# Patient Record
Sex: Female | Born: 1970 | Race: White | Hispanic: No | Marital: Married | Smoking: Current every day smoker
Health system: Southern US, Community
[De-identification: ages and names within clinical notes are randomized; demographics above are authoritative.]

## PROBLEM LIST (undated history)

## (undated) DIAGNOSIS — E079 Disorder of thyroid, unspecified: Secondary | ICD-10-CM

## (undated) HISTORY — DX: Disorder of thyroid, unspecified: E07.9

## (undated) HISTORY — PX: APPENDECTOMY: SHX54

---

## 2012-08-18 ENCOUNTER — Other Ambulatory Visit (HOSPITAL_COMMUNITY): Payer: Self-pay | Admitting: Nurse Practitioner

## 2012-08-18 DIAGNOSIS — R221 Localized swelling, mass and lump, neck: Secondary | ICD-10-CM

## 2012-08-27 ENCOUNTER — Ambulatory Visit (HOSPITAL_COMMUNITY): Admission: RE | Admit: 2012-08-27 | Payer: Self-pay | Source: Ambulatory Visit

## 2013-06-22 ENCOUNTER — Encounter (INDEPENDENT_AMBULATORY_CARE_PROVIDER_SITE_OTHER): Payer: Self-pay

## 2013-06-22 ENCOUNTER — Encounter: Payer: Self-pay | Admitting: Family Medicine

## 2013-06-22 ENCOUNTER — Ambulatory Visit (INDEPENDENT_AMBULATORY_CARE_PROVIDER_SITE_OTHER): Payer: BC Managed Care – PPO | Admitting: Family Medicine

## 2013-06-22 VITALS — BP 104/61 | HR 90 | Temp 97.0°F | Ht 67.0 in | Wt 193.0 lb

## 2013-06-22 DIAGNOSIS — Z716 Tobacco abuse counseling: Secondary | ICD-10-CM

## 2013-06-22 DIAGNOSIS — L0291 Cutaneous abscess, unspecified: Secondary | ICD-10-CM

## 2013-06-22 DIAGNOSIS — Z7189 Other specified counseling: Secondary | ICD-10-CM

## 2013-06-22 DIAGNOSIS — F17200 Nicotine dependence, unspecified, uncomplicated: Secondary | ICD-10-CM

## 2013-06-22 MED ORDER — DOXYCYCLINE HYCLATE 100 MG PO CAPS: 100.0000 mg | ORAL_CAPSULE | Freq: Two times a day (BID) | ORAL | Status: DC

## 2013-06-22 MED ORDER — HYDROCODONE-ACETAMINOPHEN 5-325 MG PO TABS: 1.0000 | ORAL_TABLET | Freq: Four times a day (QID) | ORAL | Status: DC | PRN

## 2013-06-22 NOTE — Patient Instructions (Addendum)
Abscess An abscess is an infected area that contains a collection of pus and debris.It can occur in almost any part of the body. An abscess is also known as a furuncle or boil. CAUSES  An abscess occurs when tissue gets infected. This can occur from blockage of oil or sweat glands, infection of hair follicles, or a minor injury to the skin. As the body tries to fight the infection, pus collects in the area and creates pressure under the skin. This pressure causes pain. People with weakened immune systems have difficulty fighting infections and get certain abscesses more often.  SYMPTOMS Usually an abscess develops on the skin and becomes a painful mass that is red, warm, and tender. If the abscess forms under the skin, you may feel a moveable soft area under the skin. Some abscesses break open (rupture) on their own, but most will continue to get worse without care. The infection can spread deeper into the body and eventually into the bloodstream, causing you to feel ill.  DIAGNOSIS  Your caregiver will take your medical history and perform a physical exam. A sample of fluid may also be taken from the abscess to determine what is causing your infection. TREATMENT  Your caregiver may prescribe antibiotic medicines to fight the infection. However, taking antibiotics alone usually does not cure an abscess. Your caregiver may need to make a small cut (incision) in the abscess to drain the pus. In some cases, gauze is packed into the abscess to reduce pain and to continue draining the area. HOME CARE INSTRUCTIONS   Only take over-the-counter or prescription medicines for pain, discomfort, or fever as directed by your caregiver.  If you were prescribed antibiotics, take them as directed. Finish them even if you start to feel better.  If gauze is used, follow your caregiver's directions for changing the gauze.  To avoid spreading the infection:  Keep your draining abscess covered with a  bandage.  Wash your hands well.  Do not share personal care items, towels, or whirlpools with others.  Avoid skin contact with others.  Keep your skin and clothes clean around the abscess.  Keep all follow-up appointments as directed by your caregiver. SEEK MEDICAL CARE IF:   You have increased pain, swelling, redness, fluid drainage, or bleeding.  You have muscle aches, chills, or a general ill feeling.  You have a fever. MAKE SURE YOU:   Understand these instructions.  Will watch your condition.  Will get help right away if you are not doing well or get worse. Document Released: 05/29/2005 Document Revised: 02/18/2012 Document Reviewed: 11/01/2011 [employer] Patient Information 2014 [employer].  

## 2013-06-22 NOTE — Progress Notes (Signed)
New Patient History and Physical  Patient name: Haley Keith Medical record number: [id number] Date of birth: 01-Nov-1970 Age: 42 y.o. Gender: female  Primary Care Provider: Rudi Heap, MD  Chief Complaint: abscess  History of Present Illness: The patient presents today with chief complaint of left upper outer thigh abscess. Patient has a baseline history of hidradenitis in recurrent superficial abscess ease in the past. Has been seen by dermatology for this though not recently. No known diagnosis of MRSA in the past. Patient is a smoker. Nondiabetic. Patient states that she noticed a lesion about 4-5 days ago. Has had progressive redness and irritation. No fevers or chills. No nausea or vomiting.  Past Medical History: There are no active problems to display for this patient.  Past Medical History  Diagnosis Date  . Thyroid disease     hypothyroidism    Past Surgical History: Past Surgical History  Procedure Laterality Date  . Appendectomy    . Cesarean section      Social History: History   Social History  . Marital Status: Married    Spouse Name: N/A    Number of Children: N/A  . Years of Education: N/A   Social History Main Topics  . Smoking status: Current Every Day Smoker -- 1.00 packs/day for 10 years    Types: Cigarettes  . Smokeless tobacco: Never Used  . Alcohol Use: No  . Drug Use: No  . Sexual Activity: None   Other Topics Concern  . None   Social History Narrative  . None    Family History: Family History  Problem Relation Age of Onset  . Hypertension Mother   . Diabetes Mother   . COPD Mother   . COPD Father   . Diabetes Brother   . Thyroid disease Brother     hypothyroidism due to treatment for hyperthyroidism    Allergies: No Known Allergies  Current Outpatient Prescriptions  Medication Sig Dispense Refill  . levothyroxine (SYNTHROID, LEVOTHROID) 150 MCG tablet Take 150 mcg by mouth daily before breakfast.      . phentermine 37.5  MG capsule Take 37.5 mg by mouth every morning.      Marland Kitchen doxycycline (VIBRAMYCIN) 100 MG capsule Take 1 capsule (100 mg total) by mouth 2 (two) times daily.  20 capsule  0  . HYDROcodone-acetaminophen (NORCO/VICODIN) 5-325 MG per tablet Take 1 tablet by mouth every 6 (six) hours as needed for pain.  30 tablet  0   No current facility-administered medications for this visit.   Review Of Systems: 12 point ROS negative except as noted above in HPI.  Physical Exam: Filed Vitals:   06/22/13 1018  BP: 104/61  Pulse: 90  Temp: 97 F (36.1 C)    General: alert and cooperative HEENT: PERRLA and extra ocular movement intact Heart: S1, S2 normal, no murmur, rub or gallop, regular rate and rhythm Lungs: clear to auscultation Abdomen: abdomen is soft without significant tenderness, masses, organomegaly or guarding Extremities: L upper thigh/buttock abscess with marked induration and ttp  Skin:+ abscess in L upper thigh  Neurology: normal without focal findings  Labs and Imaging:  Assessment and Plan: Procedure:  Incision and drainage. Overall risk and benefits were discussed with patient prior to her seizure. Affected area was cleansed with Betadine. 2% Marcaine with epinephrine injected into affected area for anesthesia and clockwise fashion. Approximately 10-12 cc of lidocaine use. #11 blade use for incision of most indurated area with expression of copious amounts of purulent fluid. Anterior  abscess to right with sterile curved hemostats. Area packed with quarter-inch iodoform gauze. Topically packed. Minimal to mild amount of blood loss.  Cellulitis and abscess - Plan: doxycycline (VIBRAMYCIN) 100 MG capsule, HYDROcodone-acetaminophen (NORCO/VICODIN) 5-325 MG per tablet, Aerobic culture  Affected area incised and drained at bedside. We'll place on doxycycline for infectious coverage. Wound culture. Vicodin for pain. Plan for followup in the next 2 days for reevaluation. Will need surgery  followup given history of recurrent skin disease. Discussed with the patient. Also discussed infectious red flags. Follow up as needed.       Doree Albee MD

## 2013-06-23 ENCOUNTER — Encounter: Payer: Self-pay | Admitting: General Practice

## 2013-06-23 ENCOUNTER — Ambulatory Visit (INDEPENDENT_AMBULATORY_CARE_PROVIDER_SITE_OTHER): Payer: BC Managed Care – PPO | Admitting: General Practice

## 2013-06-23 VITALS — BP 110/84 | HR 84 | Temp 101.8°F | Wt 198.0 lb

## 2013-06-23 DIAGNOSIS — R52 Pain, unspecified: Secondary | ICD-10-CM

## 2013-06-23 DIAGNOSIS — R509 Fever, unspecified: Secondary | ICD-10-CM

## 2013-06-23 DIAGNOSIS — R51 Headache: Secondary | ICD-10-CM

## 2013-06-23 NOTE — Patient Instructions (Signed)
Headaches, Frequently Asked Questions MIGRAINE HEADACHES Q: What is migraine? What causes it? How can I treat it? A: Generally, migraine headaches begin as a dull ache. Then they develop into a constant, throbbing, and pulsating pain. You may experience pain at the temples. You may experience pain at the front or back of one or both sides of the head. The pain is usually accompanied by a combination of:  Nausea.  Vomiting.  Sensitivity to light and noise. Some people (about 15%) experience an aura (see below) before an attack. The cause of migraine is believed to be chemical reactions in the brain. Treatment for migraine may include over-the-counter or prescription medications. It may also include self-help techniques. These include relaxation training and biofeedback.  Q: What is an aura? A: About 15% of people with migraine get an "aura". This is a sign of neurological symptoms that occur before a migraine headache. You may see wavy or jagged lines, dots, or flashing lights. You might experience tunnel vision or blind spots in one or both eyes. The aura can include visual or auditory hallucinations (something imagined). It may include disruptions in smell (such as strange odors), taste or touch. Other symptoms include:  Numbness.  A "pins and needles" sensation.  Difficulty in recalling or speaking the correct word. These neurological events may last as long as 60 minutes. These symptoms will fade as the headache begins. Q: What is a trigger? A: Certain physical or environmental factors can lead to or "trigger" a migraine. These include:  Foods.  Hormonal changes.  Weather.  Stress. It is important to remember that triggers are different for everyone. To help prevent migraine attacks, you need to figure out which triggers affect you. Keep a headache diary. This is a good way to track triggers. The diary will help you talk to your healthcare professional about your condition. Q: Does  weather affect migraines? A: Bright sunshine, hot, humid conditions, and drastic changes in barometric pressure may lead to, or "trigger," a migraine attack in some people. But studies have shown that weather does not act as a trigger for everyone with migraines. Q: What is the link between migraine and hormones? A: Hormones start and regulate many of your body's functions. Hormones keep your body in balance within a constantly changing environment. The levels of hormones in your body are unbalanced at times. Examples are during menstruation, pregnancy, or menopause. That can lead to a migraine attack. In fact, about three quarters of all women with migraine report that their attacks are related to the menstrual cycle.  Q: Is there an increased risk of stroke for migraine sufferers? A: The likelihood of a migraine attack causing a stroke is very remote. That is not to say that migraine sufferers cannot have a stroke associated with their migraines. In persons under age 40, the most common associated factor for stroke is migraine headache. But over the course of a person's normal life span, the occurrence of migraine headache may actually be associated with a reduced risk of dying from cerebrovascular disease due to stroke.  Q: What are acute medications for migraine? A: Acute medications are used to treat the pain of the headache after it has started. Examples over-the-counter medications, NSAIDs, ergots, and triptans.  Q: What are the triptans? A: Triptans are the newest class of abortive medications. They are specifically targeted to treat migraine. Triptans are vasoconstrictors. They moderate some chemical reactions in the brain. The triptans work on receptors in your brain. Triptans help   to restore the balance of a neurotransmitter called serotonin. Fluctuations in levels of serotonin are thought to be a main cause of migraine.  Q: Are over-the-counter medications for migraine effective? A:  Over-the-counter, or "OTC," medications may be effective in relieving mild to moderate pain and associated symptoms of migraine. But you should see your caregiver before beginning any treatment regimen for migraine.  Q: What are preventive medications for migraine? A: Preventive medications for migraine are sometimes referred to as "prophylactic" treatments. They are used to reduce the frequency, severity, and length of migraine attacks. Examples of preventive medications include antiepileptic medications, antidepressants, beta-blockers, calcium channel blockers, and NSAIDs (nonsteroidal anti-inflammatory drugs). Q: Why are anticonvulsants used to treat migraine? A: During the past few years, there has been an increased interest in antiepileptic drugs for the prevention of migraine. They are sometimes referred to as "anticonvulsants". Both epilepsy and migraine may be caused by similar reactions in the brain.  Q: Why are antidepressants used to treat migraine? A: Antidepressants are typically used to treat people with depression. They may reduce migraine frequency by regulating chemical levels, such as serotonin, in the brain.  Q: What alternative therapies are used to treat migraine? A: The term "alternative therapies" is often used to describe treatments considered outside the scope of conventional Western medicine. Examples of alternative therapy include acupuncture, acupressure, and yoga. Another common alternative treatment is herbal therapy. Some herbs are believed to relieve headache pain. Always discuss alternative therapies with your caregiver before proceeding. Some herbal products contain arsenic and other toxins. TENSION HEADACHES Q: What is a tension-type headache? What causes it? How can I treat it? A: Tension-type headaches occur randomly. They are often the result of temporary stress, anxiety, fatigue, or anger. Symptoms include soreness in your temples, a tightening band-like sensation  around your head (a "vice-like" ache). Symptoms can also include a pulling feeling, pressure sensations, and contracting head and neck muscles. The headache begins in your forehead, temples, or the back of your head and neck. Treatment for tension-type headache may include over-the-counter or prescription medications. Treatment may also include self-help techniques such as relaxation training and biofeedback. CLUSTER HEADACHES Q: What is a cluster headache? What causes it? How can I treat it? A: Cluster headache gets its name because the attacks come in groups. The pain arrives with little, if any, warning. It is usually on one side of the head. A tearing or bloodshot eye and a runny nose on the same side of the headache may also accompany the pain. Cluster headaches are believed to be caused by chemical reactions in the brain. They have been described as the most severe and intense of any headache type. Treatment for cluster headache includes prescription medication and oxygen. SINUS HEADACHES Q: What is a sinus headache? What causes it? How can I treat it? A: When a cavity in the bones of the face and skull (a sinus) becomes inflamed, the inflammation will cause localized pain. This condition is usually the result of an allergic reaction, a tumor, or an infection. If your headache is caused by a sinus blockage, such as an infection, you will probably have a fever. An x-ray will confirm a sinus blockage. Your caregiver's treatment might include antibiotics for the infection, as well as antihistamines or decongestants.  REBOUND HEADACHES Q: What is a rebound headache? What causes it? How can I treat it? A: A pattern of taking acute headache medications too often can lead to a condition known as "rebound headache."   A pattern of taking too much headache medication includes taking it more than 2 days per week or in excessive amounts. That means more than the label or a caregiver advises. With rebound  headaches, your medications not only stop relieving pain, they actually begin to cause headaches. Doctors treat rebound headache by tapering the medication that is being overused. Sometimes your caregiver will gradually substitute a different type of treatment or medication. Stopping may be a challenge. Regularly overusing a medication increases the potential for serious side effects. Consult a caregiver if you regularly use headache medications more than 2 days per week or more than the label advises. ADDITIONAL QUESTIONS AND ANSWERS Q: What is biofeedback? A: Biofeedback is a self-help treatment. Biofeedback uses special equipment to monitor your body's involuntary physical responses. Biofeedback monitors:  Breathing.  Pulse.  Heart rate.  Temperature.  Muscle tension.  Brain activity. Biofeedback helps you refine and perfect your relaxation exercises. You learn to control the physical responses that are related to stress. Once the technique has been mastered, you do not need the equipment any more. Q: Are headaches hereditary? A: Four out of five (80%) of people that suffer report a family history of migraine. Scientists are not sure if this is genetic or a family predisposition. Despite the uncertainty, a child has a 50% chance of having migraine if one parent suffers. The child has a 75% chance if both parents suffer.  Q: Can children get headaches? A: By the time they reach high school, most young people have experienced some type of headache. Many safe and effective approaches or medications can prevent a headache from occurring or stop it after it has begun.  Q: What type of doctor should I see to diagnose and treat my headache? A: Start with your primary caregiver. Discuss his or her experience and approach to headaches. Discuss methods of classification, diagnosis, and treatment. Your caregiver may decide to recommend you to a headache specialist, depending upon your symptoms or other  physical conditions. Having diabetes, allergies, etc., may require a more comprehensive and inclusive approach to your headache. The National Head[employer] Foundation will provide, upon request, a list of [facility] physician members in your state. Document Released: 11/09/2003 Document Revised: 11/11/2011 Document Reviewed: 04/18/2008 [facility] Patient Information 2014 [place], [place].  Fever  Fever is a higher-than-normal body temperature. A normal temperature varies with:  Age.  How it is measured (mouth, underarm, rectal, or ear).  Time of day. In an adult, an oral temperature around 98.6 Fahrenheit (F) or 37 Celsius (C) is considered normal. A rise in temperature of about 1.8 F or 1 C is generally considered a fever (100.4 F or 38 C). In an infant age 33 days or less, a rectal temperature of 100.4 F (38 C) generally is regarded as fever. Fever is not a disease but can be a symptom of illness. CAUSES   Fever is most commonly caused by infection.  Some non-infectious problems can cause fever. For example:  Some arthritis problems.  Problems with the thyroid or adrenal glands.  Immune system problems.  Some kinds of cancer.  A reaction to certain medicines.  Occasionally, the source of a fever cannot be determined. This is sometimes called a "Fever of Unknown Origin" (FUO).  Some situations may lead to a temporary rise in body temperature that may go away on its own. Examples are:  Childbirth.  Surgery.  Some situations may cause a rise in body temperature but these are not considered "true  fever". Examples are:  Intense exercise.  Dehydration.  Exposure to high outside or room temperatures. SYMPTOMS   Feeling warm or hot.  Fatigue or feeling exhausted.  Aching all over.  Chills.  Shivering.  Sweats. DIAGNOSIS  A fever can be suspected by your caregiver feeling that your skin is unusually warm. The fever is confirmed by taking a temperature with a thermometer.  Temperatures can be taken different ways. Some methods are accurate and some are not: With adults, adolescents, and children:   An oral temperature is used most commonly.  An ear thermometer will only be accurate if it is positioned as recommended by the manufacturer.  Under the arm temperatures are not accurate and not recommended.  Most electronic thermometers are fast and accurate. Infants and Toddlers:  Rectal temperatures are recommended and most accurate.  Ear temperatures are not accurate in this age group and are not recommended.  Skin thermometers are not accurate. RISKS AND COMPLICATIONS   During a fever, the body uses more oxygen, so a person with a fever may develop rapid breathing or shortness of breath. This can be dangerous especially in people with heart or lung disease.  The sweats that occur following a fever can cause dehydration.  High fever can cause seizures in infants and children.  Older persons can develop confusion during a fever. TREATMENT   Medications may be used to control temperature.  Do not give aspirin to children with fevers. There is an association with Reye's syndrome. Reye's syndrome is a rare but potentially deadly disease.  If an infection is present and medications have been prescribed, take them as directed. Finish the full course of medications until they are gone.  Sponging or bathing with room-temperature water may help reduce body temperature. Do not use ice water or alcohol sponge baths.  Do not over-bundle children in blankets or heavy clothes.  Drinking adequate fluids during an illness with fever is important to prevent dehydration. HOME CARE INSTRUCTIONS   For adults, rest and adequate fluid intake are important. Dress according to how you feel, but do not over-bundle.  Drink enough water and/or fluids to keep your urine clear or pale yellow.  For infants over 3 months and children, giving medication as directed by your  caregiver to control fever can help with comfort. The amount to be given is based on the child's weight. Do NOT give more than is recommended. SEEK MEDICAL CARE IF:   You or your child are unable to keep fluids down.  Vomiting or diarrhea develops.  You develop a skin rash.  An oral temperature above 102 F (38.9 C) develops, or a fever which persists for over 3 days.  You develop excessive weakness, dizziness, fainting or extreme thirst.  Fevers keep coming back after 3 days. SEEK IMMEDIATE MEDICAL CARE IF:   Shortness of breath or trouble breathing develops  You pass out.  You feel you are making little or no urine.  New pain develops that was not there before (such as in the head, neck, chest, back, or abdomen).  You cannot hold down fluids.  Vomiting and diarrhea persist for more than a day or two.  You develop a stiff neck and/or your eyes become sensitive to light.  An unexplained temperature above 102 F (38.9 C) develops. Document Released: 08/19/2005 Document Revised: 11/11/2011 Document Reviewed: 08/04/2008 [facility] Patient Information 2014 [place], [place].

## 2013-06-23 NOTE — Progress Notes (Signed)
  Subjective:    Patient ID: Haley Keith, female    DOB: Sep 22, 1970, 42 y.o.   MRN: [id number]  Headache  This is a new problem. The current episode started yesterday. The problem occurs intermittently. The problem has been unchanged. The pain is located in the frontal region. The pain does not radiate. The pain quality is similar to prior headaches. The quality of the pain is described as aching. The pain is at a severity of 3/10. Associated symptoms include dizziness, a fever and muscle aches. Pertinent negatives include no abdominal pain, blurred vision, coughing, drainage, ear pain, nausea, neck pain, numbness, phonophobia, photophobia, sinus pressure, sore throat, tingling, tinnitus, visual change, vomiting or weakness. She has tried acetaminophen and NSAIDs for the symptoms. The treatment provided moderate relief. There is no history of cluster headaches, migraine headaches, recent head traumas or sinus disease.  Reports incision and drainage of left thigh abscess on yesterday and taking antibiotics as prescribed. Reports dressing dry and intact.      Review of Systems  Constitutional: Positive for fever and chills.       Temp unmeasured  HENT: Negative for congestion, ear pain, postnasal drip, sinus pressure, sore throat and tinnitus.   Eyes: Negative for blurred vision and photophobia.  Respiratory: Negative for cough, chest tightness and shortness of breath.   Cardiovascular: Negative for chest pain and palpitations.  Gastrointestinal: Negative for nausea, vomiting and abdominal pain.  Musculoskeletal: Negative for neck pain.  Neurological: Positive for dizziness and headaches. Negative for tingling, weakness and numbness.       Objective:   Physical Exam  Constitutional: She is oriented to person, place, and time. She appears well-developed and well-nourished.  HENT:  Head: Normocephalic and atraumatic.  Right Ear: External ear normal.  Left Ear: External ear normal.   Mouth/Throat: Oropharynx is clear and moist.  Eyes: Conjunctivae and EOM are normal. Pupils are equal, round, and reactive to light.  Neck: Normal range of motion. Neck supple. No thyromegaly present.  Cardiovascular: Normal rate, regular rhythm and normal heart sounds.   Pulmonary/Chest: Effort normal and breath sounds normal.  Lymphadenopathy:    She has no cervical adenopathy.  Neurological: She is alert and oriented to person, place, and time.  Skin: Skin is warm and dry.  Psychiatric: She has a normal mood and affect.    Results for orders placed in visit on 06/23/13  POCT INFLUENZA A/B      Result Value Range   Influenza A, POC Negative     Influenza B, POC Negative           Assessment & Plan:  1. Fever,  2. Body aches, and 3. Headache  - POCT Influenza A/B - POCT CBC - CBC With differential/Platelet (results incomplete, preliminary wbc 7.8) -Continue antibiotic and vicodin as prescribed -Continue monitoring temperature -may call provider on call with questions or concerns -seek emergency medical treatment if temperature greater than 102 -Maintain scheduled appointment  -Patient verbalized understanding -Coralie Keens, FNP-C

## 2013-06-24 ENCOUNTER — Encounter: Payer: Self-pay | Admitting: Family Medicine

## 2013-06-24 ENCOUNTER — Ambulatory Visit (INDEPENDENT_AMBULATORY_CARE_PROVIDER_SITE_OTHER): Payer: BC Managed Care – PPO | Admitting: Family Medicine

## 2013-06-24 VITALS — BP 93/59 | HR 85 | Temp 98.4°F | Ht 67.0 in | Wt 198.0 lb

## 2013-06-24 DIAGNOSIS — R509 Fever, unspecified: Secondary | ICD-10-CM

## 2013-06-24 DIAGNOSIS — L0231 Cutaneous abscess of buttock: Secondary | ICD-10-CM

## 2013-06-24 LAB — CBC WITH DIFFERENTIAL
Basos: 0 %
Eos: 1 %
HCT: 36.3 % (ref 34.0–46.6)
Hemoglobin: 12.5 g/dL (ref 11.1–15.9)
Immature Grans (Abs): 0 10*3/uL (ref 0.0–0.1)
Lymphocytes Absolute: 1 10*3/uL (ref 0.7–3.1)
MCH: 31.3 pg (ref 26.6–33.0)
MCV: 91 fL (ref 79–97)
Monocytes Absolute: 0.5 10*3/uL (ref 0.1–0.9)
Neutrophils Absolute: 5.5 10*3/uL (ref 1.4–7.0)
RBC: 4 x10E6/uL (ref 3.77–5.28)

## 2013-06-24 LAB — POCT CBC
Hemoglobin: 13 g/dL (ref 12.2–16.2)
Lymph, poc: 2.5 (ref 0.6–3.4)
MCH, POC: 30.2 pg (ref 27–31.2)
MCHC: 33.1 g/dL (ref 31.8–35.4)
MCV: 91.4 fL (ref 80–97)
MPV: 8.6 fL (ref 0–99.8)
POC LYMPH PERCENT: 34.5 %L (ref 10–50)
Platelet Count, POC: 202 10*3/uL (ref 142–424)
RBC: 4.3 M/uL (ref 4.04–5.48)
RDW, POC: 14.7 %
WBC: 7.3 10*3/uL (ref 4.6–10.2)

## 2013-06-24 NOTE — Progress Notes (Signed)
  Subjective:    Patient ID: Haley Keith, female    DOB: 10-10-1970, 42 y.o.   MRN: [id number]  HPI Patient presents today for wound recheck. Patient seen 2 days ago for a left buttock abscess. Area was incised and drained at bedside. Wound cultures obtained. Patient was placed on oral doxycycline for MRSA coverage. In discussing with patient, and she reports that she was seen yesterday for fever with a MAXIMUM TEMPERATURE of 101.5 as well as generalized malaise. Patient was seen here at [facility]. A CBC was drawn with patient having a white count around 7. Patient was formally diagnosed with a questionable viral infection. Patient states her last fever was yesterday evening with a temperature around 101. Patient has defervescence this point. Per patient, left buttock abscess has seemed to clinically improve mildly to moderately. Pain is significantly improved. Does still have some pain with sitting.   Review of Systems  All other systems reviewed and are negative.       Objective:   Physical Exam  Constitutional: She appears well-developed and well-nourished.  HENT:  Head: Normocephalic and atraumatic.  Eyes: Pupils are equal, round, and reactive to light.  Neck: Normal range of motion.  Cardiovascular: Normal rate and regular rhythm.   Pulmonary/Chest: Effort normal.  Abdominal: Soft.  Neurological: She is alert.  Skin: Rash noted.          Assessment & Plan:  The lesion is clinically improved with overall reassuring exam, however-given the patient has had a significant fever within the past 24 hours and there is some concern about outpatient treatment failure. Discussed with patient at length she would benefit from hospitalization to further evaluate her symptoms. Patient adamantly refuses this. Patient states she feels well and there is no need for this . CBC today is reassuring with a white blood cell count of 7.3 (unchanged from yesterday). Wound  culture still pending.  Hemodynamically stable. However we'll also obtain blood cultures to rule out bacteremia. Patient is agreeable that if she develops any recurrence of systemic symptoms including generalized malaise or fever she will go directly to the hospital. Discussed with patient at length the importance of this as she is at high risk of systemic illness given recent symptoms. The patient expressed understanding of this. Area repacked and dressing changed.

## 2013-06-26 ENCOUNTER — Ambulatory Visit (INDEPENDENT_AMBULATORY_CARE_PROVIDER_SITE_OTHER): Payer: BC Managed Care – PPO | Admitting: Family Medicine

## 2013-06-26 VITALS — BP 100/64 | HR 76 | Temp 98.3°F | Ht 67.0 in | Wt 198.0 lb

## 2013-06-26 DIAGNOSIS — Z4800 Encounter for change or removal of nonsurgical wound dressing: Secondary | ICD-10-CM

## 2013-06-26 NOTE — Patient Instructions (Signed)
Keep area dry. Go to ED if fever develops.

## 2013-06-26 NOTE — Progress Notes (Signed)
Patient returns today for dressing change and wound check. Reports no pain at this time. She has not taken pain medication in 2 days. Dressing became wet when she showered yesterday even though she attempted to cover it with plastic. She replaced the dressing with a dry one.  Redness and induration size has decreased.  No drainage expressed from wound. Small amount of purulent drainage present on dressing. Satellite area has decreased in size, pain and redness as well.    Area cleaned with NS. Iodoform packing replaced. Wound covered with 2x2 and secured with hypafix. Several large pieces of tegaderm given to patient to cover area before showering. She will change entire dressing if it becomes wet. Appt scheduled for dressing change in 2 days. Go to ED if fever develops. Pt states understanding and agreement to plan.

## 2013-06-27 ENCOUNTER — Encounter: Payer: Self-pay | Admitting: Family Medicine

## 2013-06-28 ENCOUNTER — Ambulatory Visit (INDEPENDENT_AMBULATORY_CARE_PROVIDER_SITE_OTHER): Payer: BC Managed Care – PPO | Admitting: *Deleted

## 2013-06-28 DIAGNOSIS — Z4800 Encounter for change or removal of nonsurgical wound dressing: Secondary | ICD-10-CM

## 2013-06-28 NOTE — Progress Notes (Signed)
Patient comes in today for dressing change. She had to change the outer dressing yesterday because it got wet in the shower.  Denies any pain. Has not taken any pain medication since last week.  She noted a small amount of drainage on dressing yesterday.  Denies fever or chills.  Dressing removed. Small amount of serosanguinous drainage on dressing.  Erythema greatly improved. Induration has decreased to 2.5 x 2.5cm.  Area cleaned with NS. Repacked with iodoform gauze and covered with sterile 2x2 and secured with Hypafix. Patient to return to clinic in 3 days for rck with Dr. Alvester Morin and PRN.

## 2013-07-01 ENCOUNTER — Ambulatory Visit (INDEPENDENT_AMBULATORY_CARE_PROVIDER_SITE_OTHER): Payer: BC Managed Care – PPO | Admitting: Family Medicine

## 2013-07-01 ENCOUNTER — Encounter: Payer: Self-pay | Admitting: Family Medicine

## 2013-07-01 VITALS — BP 99/64 | HR 78 | Temp 97.8°F | Ht 67.0 in | Wt 194.0 lb

## 2013-07-01 DIAGNOSIS — Z716 Tobacco abuse counseling: Secondary | ICD-10-CM

## 2013-07-01 DIAGNOSIS — L0291 Cutaneous abscess, unspecified: Secondary | ICD-10-CM

## 2013-07-01 DIAGNOSIS — Z7189 Other specified counseling: Secondary | ICD-10-CM

## 2013-07-01 DIAGNOSIS — F17200 Nicotine dependence, unspecified, uncomplicated: Secondary | ICD-10-CM

## 2013-07-01 MED ORDER — DOXYCYCLINE HYCLATE 100 MG PO CAPS: 100.0000 mg | ORAL_CAPSULE | Freq: Two times a day (BID) | ORAL | Status: DC

## 2013-07-01 NOTE — Progress Notes (Signed)
  Subjective:    Patient ID: Haley Keith, female    DOB: [birth year], 42 y.o.   MRN: [id number]  HPI Pt presents today for wound follow up  Was originally seen on 10/21 for L buttock abscess.  Wound cx grew out MSSA Has bene on doxy since 10/21. Compliant with this.  Had one episode of fever 10/21.  Was seen following day and dxd with fever.  Pt has not had any fevers since this point.      Review of Systems  All other systems reviewed and are negative.       Objective:   Physical Exam  Constitutional: She appears well-developed and well-nourished.  HENT:  Head: Normocephalic and atraumatic.  Eyes: Conjunctivae are normal. Pupils are equal, round, and reactive to light.  Neck: Normal range of motion.  Cardiovascular: Normal rate and regular rhythm.   Pulmonary/Chest: Effort normal.  Abdominal: Soft.  Musculoskeletal: Normal range of motion.  Neurological: She is alert.  Skin: Skin is warm.     R buttock abscess well healed.  Minimal tenderness and peripheral erythema.  2-3 cm iodoform packing present.            Assessment & Plan:  Cellulitis and abscess  Overall healing well.  Will extend abx for additional 4 days for total of 14 days of treatment  Discussed smoking cessation and shaving avoidance.  Followup in 3-4 days for general recheck and packing removal. Consider surgery referral if this recurs given extend of prior disease.  Discussed infectious and systemic red flags.  Go to ER if fever spikes.

## 2013-07-01 NOTE — Patient Instructions (Signed)
Wound Care Wound care helps prevent pain and infection.  You may need a tetanus shot if:  You cannot remember when you had your last tetanus shot.  You have never had a tetanus shot.  The injury broke your skin. If you need a tetanus shot and you choose not to have one, you may get tetanus. Sickness from tetanus can be serious. HOME CARE   Only take medicine as told by your doctor.  Clean the wound daily with mild soap and water.  Change any bandages (dressings) as told by your doctor.  Put medicated cream and a bandage on the wound as told by your doctor.  Change the bandage if it gets wet, dirty, or starts to smell.  Take showers. Do not take baths, swim, or do anything that puts your wound under water.  Rest and raise (elevate) the wound until the pain and puffiness (swelling) are better.  Keep all doctor visits as told. GET HELP RIGHT AWAY IF:   Yellowish-white fluid (pus) comes from the wound.  Medicine does not lessen your pain.  There is a red streak going away from the wound.  You have a fever. MAKE SURE YOU:   Understand these instructions.  Will watch your condition.  Will get help right away if you are not doing well or get worse. Document Released: 05/28/2008 Document Revised: 11/11/2011 Document Reviewed: 12/23/2010 [employer] Patient Information 2014 [employer].  

## 2013-07-02 ENCOUNTER — Telehealth: Payer: Self-pay | Admitting: Family Medicine

## 2013-07-05 NOTE — Telephone Encounter (Signed)
DONE

## 2013-07-06 ENCOUNTER — Ambulatory Visit: Payer: BC Managed Care – PPO | Admitting: Family Medicine

## 2013-07-07 ENCOUNTER — Ambulatory Visit (INDEPENDENT_AMBULATORY_CARE_PROVIDER_SITE_OTHER): Payer: BC Managed Care – PPO | Admitting: Family Medicine

## 2013-07-07 ENCOUNTER — Encounter: Payer: Self-pay | Admitting: Family Medicine

## 2013-07-07 VITALS — BP 103/60 | HR 78 | Temp 98.3°F | Ht 67.0 in | Wt 197.0 lb

## 2013-07-07 DIAGNOSIS — Z5189 Encounter for other specified aftercare: Secondary | ICD-10-CM

## 2013-07-07 DIAGNOSIS — Z716 Tobacco abuse counseling: Secondary | ICD-10-CM

## 2013-07-07 DIAGNOSIS — Z7189 Other specified counseling: Secondary | ICD-10-CM

## 2013-07-07 DIAGNOSIS — F17200 Nicotine dependence, unspecified, uncomplicated: Secondary | ICD-10-CM

## 2013-07-07 NOTE — Progress Notes (Signed)
Labs negative Please inform pt if not already done Thank you

## 2013-07-07 NOTE — Patient Instructions (Signed)
Monistat 3 day Ovule. Insert at night.

## 2013-07-07 NOTE — Progress Notes (Signed)
  Subjective:    Patient ID: Haley Keith, female    DOB: [birth year], 42 y.o.   MRN: [id number]  HPI Pt presents today for wound follow up  Was originally seen on 10/21 for L buttock abscess.  Wound cx grew out MSSA  Has bene on doxy since 10/21. Compliant with this.  Had one episode of fever 10/21.  Was seen following day and dxd with fever.  Pt has not had any fevers since this point.  Was placed on extended course of doxy for soft tissue coverage. Completed 14 day course.  Still afebrile.  Wound healing well per pt.      Review of Systems  All other systems reviewed and are negative.       Objective:   Physical Exam  Constitutional: She appears well-developed and well-nourished.  HENT:  Head: Normocephalic and atraumatic.  Eyes: Pupils are equal, round, and reactive to light.  Neck: Normal range of motion.  Cardiovascular: Normal rate and regular rhythm.   Pulmonary/Chest: Effort normal and breath sounds normal.  Abdominal: Soft.  Skin: Skin is warm.     L buttock wound  Well healed.  1-2 cm iodoform gauze packing present.            Assessment & Plan:  Wound check, abscess  Overall healing well  Near complete resolution.  Still with small pocket of space present that me loculate One more packing with aroun 1 cm iodoform gauze.  Follow up in 2-3 days for packing removal  Discussed infectious red flags at length as well as smoking cessation.

## 2014-02-11 ENCOUNTER — Telehealth: Payer: Self-pay | Admitting: Family Medicine

## 2014-02-11 NOTE — Telephone Encounter (Signed)
Patient aware that she would have to be seen to get labs and in order for [place] to call in her thyroid medicine

## 2014-04-01 ENCOUNTER — Telehealth: Payer: Self-pay | Admitting: Family Medicine

## 2014-04-04 ENCOUNTER — Telehealth: Payer: Self-pay | Admitting: Family Medicine

## 2014-04-04 NOTE — Telephone Encounter (Signed)
appt scheduled for tomorrow with bill

## 2014-04-04 NOTE — Telephone Encounter (Signed)
Denied, needs Thyroid labs ( last done in 2013), needs appt. Patient aware

## 2014-04-05 ENCOUNTER — Ambulatory Visit (INDEPENDENT_AMBULATORY_CARE_PROVIDER_SITE_OTHER): Payer: 59 | Admitting: Family Medicine

## 2014-04-05 ENCOUNTER — Encounter: Payer: Self-pay | Admitting: Family Medicine

## 2014-04-05 VITALS — BP 103/62 | HR 90 | Temp 98.1°F | Ht 67.0 in | Wt 202.6 lb

## 2014-04-05 DIAGNOSIS — L680 Hirsutism: Secondary | ICD-10-CM

## 2014-04-05 DIAGNOSIS — Z0000 Encounter for general adult medical examination without abnormal findings: Secondary | ICD-10-CM

## 2014-04-05 DIAGNOSIS — E039 Hypothyroidism, unspecified: Secondary | ICD-10-CM

## 2014-04-05 DIAGNOSIS — E038 Other specified hypothyroidism: Secondary | ICD-10-CM

## 2014-04-05 LAB — POCT CBC
Granulocyte percent: 61 %G (ref 37–80)
HCT, POC: 38.9 % (ref 37.7–47.9)
Hemoglobin: 13.1 g/dL (ref 12.2–16.2)
Lymph, poc: 3.3 (ref 0.6–3.4)
MCH, POC: 31.6 pg — AB (ref 27–31.2)
MCHC: 8.6 g/dL — AB (ref 31.8–35.4)
MCV: 93.7 fL (ref 80–97)
MPV: 8.6 fL (ref 0–99.8)
POC Granulocyte: 6 (ref 2–6.9)
POC LYMPH PERCENT: 33.7 %L (ref 10–50)
Platelet Count, POC: 215 10*3/uL (ref 142–424)
RBC: 4.2 M/uL (ref 4.04–5.48)
RDW, POC: 33.7 %
WBC: 9.8 10*3/uL (ref 4.6–10.2)

## 2014-04-05 NOTE — Progress Notes (Signed)
   Subjective:    Patient ID: Haley Keith, female    DOB: Aug 13, 1971, 43 y.o.   MRN: [id number]  HPI This 43 y.o. female presents for evaluation of routine follow up.  She has hx of hypothyroidism.   Review of Systems No chest pain, SOB, HA, dizziness, vision change, N/V, diarrhea, constipation, dysuria, urinary urgency or frequency, myalgias, arthralgias or rash.     Objective:   Physical Exam  Vital signs noted  Well developed well nourished female.  HEENT - Head atraumatic Normocephalic                Eyes - PERRLA, Conjuctiva - clear Sclera- Clear EOMI                Ears - EAC's Wnl TM's Wnl Gross Hearing WNL                Nose - Nares patent                 Throat - oropharanx wnl Respiratory - Lungs CTA bilateral Cardiac - RRR S1 and S2 without murmur GI - Abdomen soft Nontender and bowel sounds active x 4 Extremities - No edema. Neuro - Grossly intact. Skin - facial hair on chin      Assessment & Plan:  Other specified hypothyroidism - Plan: CANCELED: Thyroid Panel With TSH  Routine general medical examination at a health care facility - Plan: POCT CBC, Lipid panel, Vit D  25 hydroxy (rtn osteoporosis monitoring), CMP14+EGFR  Unspecified hypothyroidism - Plan: Thyroid Panel With TSH  Hirsutism - Plan: Ambulatory referral to Dermatology  Lysbeth Penner FNP

## 2014-04-06 LAB — LIPID PANEL
Chol/HDL Ratio: 3.8 ratio units (ref 0.0–4.4)
Cholesterol, Total: 136 mg/dL (ref 100–199)
HDL: 36 mg/dL — ABNORMAL LOW (ref >39)
LDL Calculated: 72 mg/dL (ref 0–99)
Triglycerides: 140 mg/dL (ref 0–149)
VLDL Cholesterol Cal: 28 mg/dL (ref 5–40)

## 2014-04-06 LAB — CMP14+EGFR
ALT: 14 IU/L (ref 0–32)
AST: 20 IU/L (ref 0–40)
Albumin/Globulin Ratio: 1.3 (ref 1.1–2.5)
Albumin: 4 g/dL (ref 3.5–5.5)
Alkaline Phosphatase: 60 IU/L (ref 39–117)
BUN/Creatinine Ratio: 21 (ref 9–23)
BUN: 12 mg/dL (ref 6–24)
CO2: 22 mmol/L (ref 18–29)
Calcium: 9.1 mg/dL (ref 8.7–10.2)
Chloride: 104 mmol/L (ref 97–108)
Creatinine, Ser: 0.56 mg/dL — ABNORMAL LOW (ref 0.57–1.00)
GFR calc Af Amer: 131 mL/min/{1.73_m2} (ref >59)
GFR calc non Af Amer: 114 mL/min/{1.73_m2} (ref >59)
Globulin, Total: 3.2 g/dL (ref 1.5–4.5)
Glucose: 85 mg/dL (ref 65–99)
Potassium: 4.1 mmol/L (ref 3.5–5.2)
Sodium: 141 mmol/L (ref 134–144)
Total Bilirubin: <0.2 mg/dL (ref 0.0–1.2)
Total Protein: 7.2 g/dL (ref 6.0–8.5)

## 2014-04-06 LAB — VITAMIN D 25 HYDROXY (VIT D DEFICIENCY, FRACTURES): Vit D, 25-Hydroxy: 35.9 ng/mL (ref 30.0–100.0)

## 2014-04-06 LAB — THYROID PANEL WITH TSH
Free Thyroxine Index: 2.6 (ref 1.2–4.9)
T3 Uptake Ratio: 30 % (ref 24–39)
T4, Total: 8.5 ug/dL (ref 4.5–12.0)
TSH: 4.86 u[IU]/mL — ABNORMAL HIGH (ref 0.450–4.500)

## 2014-04-09 ENCOUNTER — Other Ambulatory Visit: Payer: Self-pay | Admitting: Family Medicine

## 2014-04-09 MED ORDER — LEVOTHYROXINE SODIUM 175 MCG PO TABS: 175.0000 ug | ORAL_TABLET | Freq: Every day | ORAL | Status: DC

## 2014-04-11 ENCOUNTER — Telehealth: Payer: Self-pay | Admitting: Family Medicine

## 2014-04-11 NOTE — Telephone Encounter (Signed)
done

## 2014-08-22 ENCOUNTER — Encounter: Payer: Self-pay | Admitting: Family Medicine

## 2014-08-22 ENCOUNTER — Ambulatory Visit (INDEPENDENT_AMBULATORY_CARE_PROVIDER_SITE_OTHER): Payer: 59 | Admitting: Family Medicine

## 2014-08-22 VITALS — BP 98/64 | HR 98 | Temp 98.5°F | Ht 67.0 in | Wt 229.0 lb

## 2014-08-22 DIAGNOSIS — J209 Acute bronchitis, unspecified: Secondary | ICD-10-CM

## 2014-08-22 MED ORDER — HYDROCODONE-HOMATROPINE 5-1.5 MG/5ML PO SYRP: ORAL_SOLUTION | ORAL | Status: DC

## 2014-08-22 MED ORDER — AZITHROMYCIN 250 MG PO TABS: ORAL_TABLET | ORAL | Status: DC

## 2014-08-22 NOTE — Progress Notes (Signed)
   Subjective:    Patient ID: Haley Keith, female    DOB: 1971/08/30, 43 y.o.   MRN: [id number]  HPI  for 5 day history of cough congestion and fever. Cough is productive of yellow sputum. She denies sinus headache or congestion but she does complain of wheezes in her chest. She is a nonsmoker. She denies sore throat. Temp has been as high as 100.8.    Review of Systems  Constitutional: Positive for fever.  HENT: Negative.   Eyes: Negative.   Respiratory: Positive for cough.   Cardiovascular: Negative.   Gastrointestinal: Negative.   Endocrine: Negative.   Genitourinary: Negative.   Hematological: Negative.   Psychiatric/Behavioral: Negative.        Objective:   Physical Exam  Constitutional: She appears well-developed and well-nourished.  HENT:  Head: Normocephalic.  Right Ear: External ear normal.  Left Ear: External ear normal.  Mouth/Throat: Oropharynx is clear and moist.  Cardiovascular: Normal rate and regular rhythm.   Pulmonary/Chest: Effort normal.  There are bilateral wheezes in her lungs. No rales or rhonchi.    BP 98/64 mmHg  Pulse 98  Temp(Src) 98.5 F (36.9 C) (Oral)  Ht 5\' 7"  (1.702 m)  Wt 229 lb (103.874 kg)  BMI 35.86 kg/m2  LMP 08/01/2014      Assessment & Plan:  1. Acute bronchitis, unspecified organism Rx: Zithromax, increase fluids and Mucinex and daytime, and Hycodan one or 2 teaspoons at bedtime to limit cough at night  Wardell Honour MD

## 2014-09-10 ENCOUNTER — Ambulatory Visit (INDEPENDENT_AMBULATORY_CARE_PROVIDER_SITE_OTHER): Payer: 59 | Admitting: General Practice

## 2014-09-10 VITALS — BP 98/64 | HR 85 | Temp 96.9°F | Ht 67.0 in | Wt 232.0 lb

## 2014-09-10 DIAGNOSIS — J069 Acute upper respiratory infection, unspecified: Secondary | ICD-10-CM

## 2014-09-10 DIAGNOSIS — H6123 Impacted cerumen, bilateral: Secondary | ICD-10-CM

## 2014-09-10 DIAGNOSIS — J0100 Acute maxillary sinusitis, unspecified: Secondary | ICD-10-CM

## 2014-09-10 DIAGNOSIS — J209 Acute bronchitis, unspecified: Secondary | ICD-10-CM

## 2014-09-10 MED ORDER — DOXYCYCLINE HYCLATE 100 MG PO TABS: 100.0000 mg | ORAL_TABLET | Freq: Two times a day (BID) | ORAL | Status: DC

## 2014-09-10 MED ORDER — PREDNISONE (PAK) 10 MG PO TABS: ORAL_TABLET | ORAL | Status: DC

## 2014-09-10 NOTE — Progress Notes (Signed)
   Subjective:    Patient ID: Haley Keith, female    DOB: 15-Nov-1970, 44 y.o.   MRN: [id number]  HPI Patient presents today with complaints of cough. She was seen and treated on 08/22/2014 for similar symptoms. Reports a mild improvement in symptoms (fever resolved), but cough still present. Kehinde reports completing zpac and taking minimal amount of hycodan. Also OTC Mucinex, without relief. Reports also having facial pressure at cheek areas and clogged ear sensation.  Currently on menses.   Review of Systems  Constitutional: Negative for fever and chills.  HENT: Positive for sinus pressure.   Respiratory: Positive for cough. Negative for chest tightness and shortness of breath.   Cardiovascular: Negative for chest pain and palpitations.  Neurological: Negative for dizziness, weakness and light-headedness.       Objective:   Physical Exam  Constitutional: She is oriented to person, place, and time. She appears well-developed and well-nourished.  HENT:  Head: Normocephalic and atraumatic.  Nose: Right sinus exhibits maxillary sinus tenderness. Left sinus exhibits maxillary sinus tenderness.  Bilateral cerumen impaction  Cardiovascular: Normal rate, regular rhythm and normal heart sounds.   Pulmonary/Chest: Effort normal. No respiratory distress. She has wheezes in the right lower field and the left lower field. She exhibits no tenderness.  Mild wheezing upon exhalation.   Neurological: She is alert and oriented to person, place, and time.  Skin: Skin is warm and dry.  Psychiatric: She has a normal mood and affect.          Assessment & Plan:  1. Acute bronchitis, unspecified organism  - predniSONE (STERAPRED UNI-PAK) 10 MG tablet; Take as directed  Dispense: 21 tablet; Refill: 0  2. Acute upper respiratory infection   3. Acute maxillary sinusitis, recurrence not specified  - doxycycline (VIBRA-TABS) 100 MG tablet; Take 1 tablet (100 mg total) by mouth 2 (two) times daily.   Dispense: 14 tablet; Refill: 0 -push po fluids -Continue antibiotics even if feeling better -Proper handwashing -RTO if symptoms worsen or unresolved or seek emergency medical treatment -Follow up for chest xray if cough persists greater than 5 more days -Patient verbalized understanding Erby Pian, FNP-C  4. Cerumen impaction, bilateral -Bilateral ear irrigation (pearly gray TM afterwards) -

## 2014-09-10 NOTE — Patient Instructions (Signed)
Acute Bronchitis Bronchitis is inflammation of the airways that extend from the windpipe into the lungs (bronchi). The inflammation often causes mucus to develop. This leads to a cough, which is the most common symptom of bronchitis.  In acute bronchitis, the condition usually develops suddenly and goes away over time, usually in a couple weeks. Smoking, allergies, and asthma can make bronchitis worse. Repeated episodes of bronchitis may cause further lung problems.  CAUSES Acute bronchitis is most often caused by the same virus that causes a cold. The virus can spread from person to person (contagious) through coughing, sneezing, and touching contaminated objects. SIGNS AND SYMPTOMS   Cough.   Fever.   Coughing up mucus.   Body aches.   Chest congestion.   Chills.   Shortness of breath.   Sore throat.  DIAGNOSIS  Acute bronchitis is usually diagnosed through a physical exam. Your health care provider will also ask you questions about your medical history. Tests, such as chest X-rays, are sometimes done to rule out other conditions.  TREATMENT  Acute bronchitis usually goes away in a couple weeks. Oftentimes, no medical treatment is necessary. Medicines are sometimes given for relief of fever or cough. Antibiotic medicines are usually not needed but may be prescribed in certain situations. In some cases, an inhaler may be recommended to help reduce shortness of breath and control the cough. A cool mist vaporizer may also be used to help thin bronchial secretions and make it easier to clear the chest.  HOME CARE INSTRUCTIONS  Get plenty of rest.   Drink enough fluids to keep your urine clear or pale yellow (unless you have a medical condition that requires fluid restriction). Increasing fluids may help thin your respiratory secretions (sputum) and reduce chest congestion, and it will prevent dehydration.   Take medicines only as directed by your health care provider.  If  you were prescribed an antibiotic medicine, finish it all even if you start to feel better.  Avoid smoking and secondhand smoke. Exposure to cigarette smoke or irritating chemicals will make bronchitis worse. If you are a smoker, consider using nicotine gum or skin patches to help control withdrawal symptoms. Quitting smoking will help your lungs heal faster.   Reduce the chances of another bout of acute bronchitis by washing your hands frequently, avoiding people with cold symptoms, and trying not to touch your hands to your mouth, nose, or eyes.   Keep all follow-up visits as directed by your health care provider.  SEEK MEDICAL CARE IF: Your symptoms do not improve after 1 week of treatment.  SEEK IMMEDIATE MEDICAL CARE IF:  You develop an increased fever or chills.   You have chest pain.   You have severe shortness of breath.  You have bloody sputum.   You develop dehydration.  You faint or repeatedly feel like you are going to pass out.  You develop repeated vomiting.  You develop a severe headache. MAKE SURE YOU:   Understand these instructions.  Will watch your condition.  Will get help right away if you are not doing well or get worse. Document Released: 09/26/2004 Document Revised: 01/03/2014 Document Reviewed: 02/09/2013 ExitCare Patient Information 2015 [employer]. This information is not intended to replace advice given to you by your health care provider. Make sure you discuss any questions you have with your health care provider. Sinusitis Sinusitis is redness, soreness, and inflammation of the paranasal sinuses. Paranasal sinuses are air pockets within the bones of your face (beneath the   eyes, the middle of the forehead, or above the eyes). In healthy paranasal sinuses, mucus is able to drain out, and air is able to circulate through them by way of your nose. However, when your paranasal sinuses are inflamed, mucus and air can become trapped. This can  allow bacteria and other germs to grow and cause infection. Sinusitis can develop quickly and last only a short time (acute) or continue over a long period (chronic). Sinusitis that lasts for more than 12 weeks is considered chronic.  CAUSES  Causes of sinusitis include:  Allergies.  Structural abnormalities, such as displacement of the cartilage that separates your nostrils (deviated septum), which can decrease the air flow through your nose and sinuses and affect sinus drainage.  Functional abnormalities, such as when the small hairs (cilia) that line your sinuses and help remove mucus do not work properly or are not present. SIGNS AND SYMPTOMS  Symptoms of acute and chronic sinusitis are the same. The primary symptoms are pain and pressure around the affected sinuses. Other symptoms include:  Upper toothache.  Earache.  Headache.  Bad breath.  Decreased sense of smell and taste.  A cough, which worsens when you are lying flat.  Fatigue.  Fever.  Thick drainage from your nose, which often is green and may contain pus (purulent).  Swelling and warmth over the affected sinuses. DIAGNOSIS  Your health care provider will perform a physical exam. During the exam, your health care provider may:  Look in your nose for signs of abnormal growths in your nostrils (nasal polyps).  Tap over the affected sinus to check for signs of infection.  View the inside of your sinuses (endoscopy) using an imaging device that has a light attached (endoscope). If your health care provider suspects that you have chronic sinusitis, one or more of the following tests may be recommended:  Allergy tests.  Nasal culture. A sample of mucus is taken from your nose, sent to a lab, and screened for bacteria.  Nasal cytology. A sample of mucus is taken from your nose and examined by your health care provider to determine if your sinusitis is related to an allergy. TREATMENT  Most cases of acute  sinusitis are related to a viral infection and will resolve on their own within 10 days. Sometimes medicines are prescribed to help relieve symptoms (pain medicine, decongestants, nasal steroid sprays, or saline sprays).  However, for sinusitis related to a bacterial infection, your health care provider will prescribe antibiotic medicines. These are medicines that will help kill the bacteria causing the infection.  Rarely, sinusitis is caused by a fungal infection. In theses cases, your health care provider will prescribe antifungal medicine. For some cases of chronic sinusitis, surgery is needed. Generally, these are cases in which sinusitis recurs more than 3 times per year, despite other treatments. HOME CARE INSTRUCTIONS   Drink plenty of water. Water helps thin the mucus so your sinuses can drain more easily.  Use a humidifier.  Inhale steam 3 to 4 times a day (for example, sit in the bathroom with the shower running).  Apply a warm, moist washcloth to your face 3 to 4 times a day, or as directed by your health care provider.  Use saline nasal sprays to help moisten and clean your sinuses.  Take medicines only as directed by your health care provider.  If you were prescribed either an antibiotic or antifungal medicine, finish it all even if you start to feel better. SEEK IMMEDIATE MEDICAL   CARE IF:  You have increasing pain or severe headaches.  You have nausea, vomiting, or drowsiness.  You have swelling around your face.  You have vision problems.  You have a stiff neck.  You have difficulty breathing. MAKE SURE YOU:   Understand these instructions.  Will watch your condition.  Will get help right away if you are not doing well or get worse. Document Released: 08/19/2005 Document Revised: 01/03/2014 Document Reviewed: 09/03/2011 ExitCare Patient Information 2015 [employer]. This information is not intended to replace advice given to you by your health care provider.  Make sure you discuss any questions you have with your health care provider.  

## 2015-03-28 ENCOUNTER — Ambulatory Visit (INDEPENDENT_AMBULATORY_CARE_PROVIDER_SITE_OTHER): Payer: 59 | Admitting: Physician Assistant

## 2015-03-28 ENCOUNTER — Encounter (INDEPENDENT_AMBULATORY_CARE_PROVIDER_SITE_OTHER): Payer: Self-pay

## 2015-03-28 ENCOUNTER — Encounter: Payer: Self-pay | Admitting: Physician Assistant

## 2015-03-28 VITALS — BP 98/63 | HR 91 | Temp 97.7°F | Ht 67.0 in | Wt 250.8 lb

## 2015-03-28 DIAGNOSIS — E039 Hypothyroidism, unspecified: Secondary | ICD-10-CM | POA: Diagnosis not present

## 2015-03-28 NOTE — Progress Notes (Signed)
   Subjective:    Patient ID: [name], female    DOB: [birth year], 44 y.o.   MRN: [id number]  HPI 44 y/o female with hypothyroidism presents for recheck on thyroid levels.    Review of Systems  Constitutional: Positive for diaphoresis and fatigue.  HENT: Negative.   Eyes: Negative.   Respiratory: Negative.   Gastrointestinal: Positive for constipation.  Endocrine: Negative.   Genitourinary: Negative.   Musculoskeletal: Negative.   Neurological: Negative.   Psychiatric/Behavioral: Negative.        Objective:   Physical Exam  Constitutional: She is oriented to person, place, and time. She appears well-developed and well-nourished.  HENT:  Head: Normocephalic.  Cardiovascular: Normal rate, normal heart sounds and intact distal pulses.  Exam reveals no gallop.   No murmur heard. Pulmonary/Chest: Effort normal and breath sounds normal. No respiratory distress. She has no wheezes. She has no rales. She exhibits no tenderness.  Neurological: She is alert and oriented to person, place, and time.  Psychiatric: She has a normal mood and affect. Her behavior is normal. Judgment and thought content normal.  Vitals reviewed.         Assessment & Plan:  1. Hypothyroidism, unspecified hypothyroidism type  - Thyroid Panel With TSH   Will change dosage if needed once lab results are obtained.   Otherwise f/u in 6 months   Tylena Prisk A. Benjamin Stain PA-C

## 2015-03-29 LAB — THYROID PANEL WITH TSH
Free Thyroxine Index: 2.9 (ref 1.2–4.9)
T3 Uptake Ratio: 28 % (ref 24–39)
T4 TOTAL: 10.2 ug/dL (ref 4.5–12.0)
TSH: 0.673 u[IU]/mL (ref 0.450–4.500)

## 2015-04-04 ENCOUNTER — Telehealth: Payer: Self-pay | Admitting: Physician Assistant

## 2015-04-04 ENCOUNTER — Other Ambulatory Visit: Payer: Self-pay | Admitting: Physician Assistant

## 2015-04-04 MED ORDER — LEVOTHYROXINE SODIUM 125 MCG PO TABS: 125.0000 ug | ORAL_TABLET | Freq: Every day | ORAL | Status: DC

## 2015-04-04 NOTE — Telephone Encounter (Signed)
Pt called wanting to know if labs were in. Her thyroid panel is wnl and she is taking 133mcg synthroid, will you take a quick peek at the levels and make sure she doesn't need a dosage change. Then we will need to send in refills to [facility] in [place]. Thank you.

## 2015-04-04 NOTE — Telephone Encounter (Signed)
Yes. Please change dosage to 169mcg daily, #30, 5 refills  and keep follow up in 6 months for recheck. Thanks Tiffany A. Benjamin Stain PA-C

## 2015-04-04 NOTE — Telephone Encounter (Signed)
Left detailed message stating we sent over new dosage of levothyroxine and to CB with any further questions.

## 2015-08-09 ENCOUNTER — Ambulatory Visit: Payer: 59

## 2015-10-25 ENCOUNTER — Encounter: Payer: Self-pay | Admitting: Pediatrics

## 2015-10-25 ENCOUNTER — Ambulatory Visit (INDEPENDENT_AMBULATORY_CARE_PROVIDER_SITE_OTHER): Payer: 59 | Admitting: Pediatrics

## 2015-10-25 VITALS — BP 106/72 | HR 84 | Temp 98.0°F | Ht 67.0 in | Wt 269.2 lb

## 2015-10-25 DIAGNOSIS — E039 Hypothyroidism, unspecified: Secondary | ICD-10-CM | POA: Diagnosis not present

## 2015-10-25 DIAGNOSIS — E559 Vitamin D deficiency, unspecified: Secondary | ICD-10-CM | POA: Diagnosis not present

## 2015-10-25 DIAGNOSIS — Z6841 Body Mass Index (BMI) 40.0 and over, adult: Secondary | ICD-10-CM

## 2015-10-25 DIAGNOSIS — Z720 Tobacco use: Secondary | ICD-10-CM | POA: Diagnosis not present

## 2015-10-25 LAB — POCT GLYCOSYLATED HEMOGLOBIN (HGB A1C): Hemoglobin A1C: 5.2

## 2015-10-25 MED ORDER — LEVOTHYROXINE SODIUM 125 MCG PO TABS
125.0000 ug | ORAL_TABLET | Freq: Every day | ORAL | Status: DC
Start: 2015-10-25 — End: 2015-10-26

## 2015-10-25 NOTE — Progress Notes (Signed)
    Subjective:    Patient ID: Haley Keith, female    DOB: [birth year], 45 y.o.   MRN: [id number]  CC: Medication Refill   HPI: Haley Keith is a 45 y.o. female presenting for Medication Refill  Stays tired Gaining wt off and on thyroid  BMI elevated:  Walking every weekend Walking some during day Snack of grapes No fast food, or salad   Has 8yo and 14yo kids at home  Pap smear: pt declined, no hx abnormals, has been years since last one  No fam hx of breast ca or colon ca  Tobacco: smokes 1 ppd   Depression screen [facility] 2/9 10/25/2015 09/10/2014 04/05/2014  Decreased Interest 0 0 0  Down, Depressed, Hopeless 0 0 0  PHQ - 2 Score 0 0 0     Relevant past medical, surgical, family and social history reviewed and updated as indicated. Interim medical history since our last visit reviewed. Allergies and medications reviewed and updated.    ROS: Per HPI unless specifically indicated above  History  Smoking status  . Current Every Day Smoker -- 1.00 packs/day for 10 years  . Types: Cigarettes  Smokeless tobacco  . Never Used    Past Medical History Patient Active Problem List   Diagnosis Date Noted  . Tobacco abuse 10/25/2015  . BMI 45.0-49.9, adult ([place]) 10/25/2015  . Vitamin D deficiency 10/25/2015  . Thyroid activity decreased 10/25/2015    Current Outpatient Prescriptions  Medication Sig Dispense Refill  . levothyroxine (SYNTHROID, LEVOTHROID) 125 MCG tablet Take 1 tablet (125 mcg total) by mouth daily. 30 tablet 5   No current facility-administered medications for this visit.       Objective:    BP 106/72 mmHg  Pulse 84  Temp(Src) 98 F (36.7 C) (Oral)  Ht 5\' 7"  (1.702 m)  Wt 269 lb 3.2 oz (122.108 kg)  BMI 42.15 kg/m2  Wt Readings from Last 3 Encounters:  10/25/15 269 lb 3.2 oz (122.108 kg)  03/28/15 250 lb 12.8 oz (113.762 kg)  09/10/14 232 lb (105.235 kg)     Gen: NAD, alert, cooperative with exam, NCAT EYES: EOMI, no scleral injection  or icterus ENT:   OP without erythema Neck: no nodules or masses palpated on thyroid LYMPH: no cervical LAD CV: NRRR, normal S1/S2, no murmur, distal pulses 2+ b/l Resp: CTABL, no wheezes, normal WOB Abd: +BS, soft, NTND, obese Ext: No edema, warm Neuro: Alert and oriented     Assessment & Plan:    Haley Keith was seen today for medication refill and mulitple med problem f/u.  Diagnoses and all orders for this visit:  Hypothyroidism, unspecified hypothyroidism type -     levothyroxine (SYNTHROID, LEVOTHROID) 125 MCG tablet; Take 1 tablet (125 mcg total) by mouth daily. -     TSH  Vitamin D deficiency Recheck level -     Vitamin D, 25-hydroxy  BMI 45.0-49.9, adult ([place]) Discussed lifestyle changes. Lipid panel normal in 2015 -     POCT glycosylated hemoglobin (Hb A1C)  Tobacco abuse Pt pre-contemplative. Briefly discussed smoking strategies.   Follow up plan: Return in about 6 months (around 04/23/2016).  Assunta Found, MD [facility] 10/25/2015, 4:35 PM

## 2015-10-26 LAB — TSH: TSH: 30.63 u[IU]/mL — ABNORMAL HIGH (ref 0.450–4.500)

## 2015-10-26 LAB — VITAMIN D 25 HYDROXY (VIT D DEFICIENCY, FRACTURES): VIT D 25 HYDROXY: 16.3 ng/mL — AB (ref 30.0–100.0)

## 2015-10-26 MED ORDER — LEVOTHYROXINE SODIUM 175 MCG PO TABS: 175.0000 ug | ORAL_TABLET | Freq: Every day | ORAL | Status: DC

## 2015-10-26 MED ORDER — VITAMIN D (ERGOCALCIFEROL) 1.25 MG (50000 UNIT) PO CAPS: 50000.0000 [IU] | ORAL_CAPSULE | ORAL | Status: DC

## 2015-10-26 NOTE — Addendum Note (Signed)
Addended by: Eustaquio Maize on: 10/26/2015 01:46 PM   Modules accepted: Orders, Medications

## 2015-12-04 ENCOUNTER — Telehealth: Payer: Self-pay | Admitting: Pediatrics

## 2015-12-04 NOTE — Telephone Encounter (Signed)
Patient to be scheduled for an appointment .

## 2015-12-09 ENCOUNTER — Ambulatory Visit (INDEPENDENT_AMBULATORY_CARE_PROVIDER_SITE_OTHER): Payer: 59 | Admitting: Family Medicine

## 2015-12-09 VITALS — BP 125/70 | HR 95 | Temp 98.5°F | Ht 67.0 in | Wt 273.5 lb

## 2015-12-09 DIAGNOSIS — R399 Unspecified symptoms and signs involving the genitourinary system: Secondary | ICD-10-CM

## 2015-12-09 DIAGNOSIS — N3001 Acute cystitis with hematuria: Secondary | ICD-10-CM

## 2015-12-09 DIAGNOSIS — N3000 Acute cystitis without hematuria: Secondary | ICD-10-CM | POA: Insufficient documentation

## 2015-12-09 MED ORDER — FLUCONAZOLE 150 MG PO TABS: 150.0000 mg | ORAL_TABLET | Freq: Once | ORAL | Status: DC

## 2015-12-09 MED ORDER — CIPROFLOXACIN HCL 250 MG PO TABS: 250.0000 mg | ORAL_TABLET | Freq: Two times a day (BID) | ORAL | Status: DC

## 2015-12-09 NOTE — Patient Instructions (Signed)

## 2015-12-09 NOTE — Progress Notes (Signed)
   HPI  Patient presents today with UTI symptoms.  Patient's when she's had 4 days of burning when she urinates as well as her pubic pressure.  She had a recent course of clindamycin from her dentist that causes severe yeast infection. She had improvement after single dose of Diflucan.  She denies any fever, chills, sweats, or by mouth intolerance.  She otherwise feels well  PMH: Smoking status noted ROS: Per HPI  Objective: BP 125/70 mmHg  Pulse 95  Temp(Src) 98.5 F (36.9 C) (Oral)  Ht 5\' 7"  (1.702 m)  Wt 273 lb 8 oz (124.059 kg)  BMI 42.83 kg/m2  LMP 11/11/2015 (Approximate) Gen: NAD, alert, cooperative with exam HEENT: NCAT CV: RRR, good S1/S2, no murmur Resp: CTABL, no wheezes, non-labored Abd: Soft, nontender, some pressure in the suprapubic area, no CVA tenderness Ext: No edema, warm Neuro: Alert and oriented, No gross deficits  Assessment and plan:  # Tract infection with hematuria Treat with Cipro 7 days Culture Diflucan with recent use infection Return to clinic with any concerns or failure to improve as expected   Orders Placed This Encounter  Procedures  . Urine culture  . Urinalysis    Meds ordered this encounter  Medications  . ciprofloxacin (CIPRO) 250 MG tablet    Sig: Take 1 tablet (250 mg total) by mouth 2 (two) times daily.    Dispense:  14 tablet    Refill:  0  . fluconazole (DIFLUCAN) 150 MG tablet    Sig: Take 1 tablet (150 mg total) by mouth once. Repeat in 3 days    Dispense:  2 tablet    Refill:  0    Laroy Apple, MD [facility] 12/09/2015, 11:22 AM

## 2015-12-11 LAB — URINALYSIS
Glucose, UA: NEGATIVE
Nitrite, UA: NEGATIVE
PH UA: 5.5 (ref 5.0–7.5)
Urobilinogen, Ur: 1 mg/dL (ref 0.2–1.0)

## 2016-01-17 ENCOUNTER — Ambulatory Visit (INDEPENDENT_AMBULATORY_CARE_PROVIDER_SITE_OTHER): Payer: 59 | Admitting: Family Medicine

## 2016-01-17 ENCOUNTER — Encounter: Payer: Self-pay | Admitting: Family Medicine

## 2016-01-17 VITALS — BP 126/78 | HR 84 | Temp 98.8°F | Ht 67.0 in | Wt 268.6 lb

## 2016-01-17 DIAGNOSIS — H109 Unspecified conjunctivitis: Secondary | ICD-10-CM

## 2016-01-17 MED ORDER — CIPROFLOXACIN HCL 0.3 % OP SOLN: 2.0000 [drp] | OPHTHALMIC | Status: DC

## 2016-01-17 MED ORDER — AZITHROMYCIN 1 % OP SOLN: 1.0000 [drp] | Freq: Every day | OPHTHALMIC | Status: DC

## 2016-01-19 NOTE — Progress Notes (Signed)
BP 126/78 mmHg  Pulse 84  Temp(Src) 98.8 F (37.1 C) (Oral)  Ht 5\' 7"  (1.702 m)  Wt 268 lb 9.6 oz (121.836 kg)  BMI 42.06 kg/m2  LMP 12/27/2015 (Approximate)   Subjective:    Patient ID: Haley Keith, female    DOB: 24-Mar-1971, 44 y.o.   MRN: [id number]  HPI: Haley Keith is a 45 y.o. female presenting on 01/17/2016 for Left eye is red and watering   HPI Left eye redness and drainage and pain Patient is coming in today for left eye redness and drainage. This is been going on for the past few days. She awoke this morning with her eye matted shut and purulent drainage out of that eye. She denies any visual issues or issues with minor heat. She is having a lot of soreness in that eye and it is very pruritic. She has been trying to use some saline drops and an antihistamine eyedrop.  Relevant past medical, surgical, family and social history reviewed and updated as indicated. Interim medical history since our last visit reviewed. Allergies and medications reviewed and updated.  Review of Systems  Constitutional: Negative for fever and chills.  HENT: Negative for ear pain and tinnitus.   Eyes: Positive for pain and discharge. Negative for blurred vision.  Respiratory: Negative for cough, shortness of breath and wheezing.   Cardiovascular: Negative for chest pain, palpitations and leg swelling.  Genitourinary: Negative for dysuria and hematuria.  Musculoskeletal: Negative for myalgias, back pain and joint pain.  Skin: Negative for rash.  Neurological: Negative for dizziness, sensory change, focal weakness, weakness and headaches.  Psychiatric/Behavioral: Negative for depression and suicidal ideas.    Per HPI unless specifically indicated above     Medication List       This list is accurate as of: 01/17/16  6:29 PM.  Always use your most recent med list.               azithromycin 1 % ophthalmic solution  Commonly known as:  AZASITE  Place 1 drop into the left eye  daily. 2 on first day and then one daily for 5 days     levothyroxine 175 MCG tablet  Commonly known as:  SYNTHROID, LEVOTHROID  Take 1 tablet (175 mcg total) by mouth daily before breakfast.           Objective:    BP 126/78 mmHg  Pulse 84  Temp(Src) 98.8 F (37.1 C) (Oral)  Ht 5\' 7"  (1.702 m)  Wt 268 lb 9.6 oz (121.836 kg)  BMI 42.06 kg/m2  LMP 12/27/2015 (Approximate)  Wt Readings from Last 3 Encounters:  01/17/16 268 lb 9.6 oz (121.836 kg)  12/09/15 273 lb 8 oz (124.059 kg)  10/25/15 269 lb 3.2 oz (122.108 kg)    Physical Exam  Constitutional: She is oriented to person, place, and time. She appears well-developed and well-nourished. No distress.  Eyes: EOM are normal. Pupils are equal, round, and reactive to light. Left eye exhibits discharge. Left eye exhibits no exudate. No foreign body present in the left eye. Left conjunctiva is injected. Left conjunctiva has no hemorrhage. No scleral icterus. Left eye exhibits normal extraocular motion. Left pupil is reactive.  Cardiovascular: Normal rate, regular rhythm, normal heart sounds and intact distal pulses.   No murmur heard. Pulmonary/Chest: Effort normal and breath sounds normal. No respiratory distress. She has no wheezes.  Musculoskeletal: Normal range of motion. She exhibits no edema or tenderness.  Neurological: She is alert and  oriented to person, place, and time. Coordination normal.  Skin: Skin is warm and dry. No rash noted. She is not diaphoretic.  Psychiatric: She has a normal mood and affect. Her behavior is normal.  Nursing note and vitals reviewed.   Assessment & Plan:       Problem List Items Addressed This Visit    None    Visit Diagnoses    Conjunctivitis, left eye    -  Primary    Relevant Medications    azithromycin (AZASITE) 1 % ophthalmic solution        Follow up plan: Return if symptoms worsen or fail to improve.  Counseling provided for all of the vaccine components No orders of the  defined types were placed in this encounter.    Caryl Pina, MD [facility] 01/17/2016, 6:29 PM

## 2016-02-19 ENCOUNTER — Telehealth: Payer: Self-pay | Admitting: Pediatrics

## 2016-02-19 ENCOUNTER — Other Ambulatory Visit: Payer: Self-pay | Admitting: Pediatrics

## 2016-02-21 NOTE — Telephone Encounter (Signed)
Done already

## 2016-02-21 NOTE — Telephone Encounter (Signed)
Ok rf x 6 months and then needs f/u

## 2016-03-17 ENCOUNTER — Encounter (HOSPITAL_COMMUNITY): Payer: Self-pay | Admitting: Emergency Medicine

## 2016-03-17 ENCOUNTER — Emergency Department (HOSPITAL_COMMUNITY)
Admission: EM | Admit: 2016-03-17 | Discharge: 2016-03-17 | Disposition: A | Discharge status: Home / Self Care | Payer: 59 | Attending: Emergency Medicine | Admitting: Emergency Medicine

## 2016-03-17 ENCOUNTER — Emergency Department (HOSPITAL_COMMUNITY): Payer: 59

## 2016-03-17 DIAGNOSIS — R109 Unspecified abdominal pain: Secondary | ICD-10-CM

## 2016-03-17 DIAGNOSIS — R1013 Epigastric pain: Secondary | ICD-10-CM | POA: Diagnosis not present

## 2016-03-17 DIAGNOSIS — R911 Solitary pulmonary nodule: Secondary | ICD-10-CM | POA: Diagnosis not present

## 2016-03-17 DIAGNOSIS — F17210 Nicotine dependence, cigarettes, uncomplicated: Secondary | ICD-10-CM | POA: Insufficient documentation

## 2016-03-17 LAB — COMPREHENSIVE METABOLIC PANEL
ALBUMIN: 3.7 g/dL (ref 3.5–5.0)
ALT: 17 U/L (ref 14–54)
AST: 16 U/L (ref 15–41)
Alkaline Phosphatase: 82 U/L (ref 38–126)
Anion gap: 4 — ABNORMAL LOW (ref 5–15)
BUN: 10 mg/dL (ref 6–20)
CHLORIDE: 107 mmol/L (ref 101–111)
CO2: 25 mmol/L (ref 22–32)
Calcium: 8.9 mg/dL (ref 8.9–10.3)
Creatinine, Ser: 0.62 mg/dL (ref 0.44–1.00)
GFR calc Af Amer: >60 mL/min (ref >60)
GFR calc non Af Amer: >60 mL/min (ref >60)
GLUCOSE: 103 mg/dL — AB (ref 65–99)
POTASSIUM: 4 mmol/L (ref 3.5–5.1)
Sodium: 136 mmol/L (ref 135–145)
Total Bilirubin: 0.2 mg/dL — ABNORMAL LOW (ref 0.3–1.2)
Total Protein: 8.2 g/dL — ABNORMAL HIGH (ref 6.5–8.1)

## 2016-03-17 LAB — URINALYSIS, ROUTINE W REFLEX MICROSCOPIC
Bilirubin Urine: NEGATIVE
Glucose, UA: NEGATIVE mg/dL
KETONES UR: NEGATIVE mg/dL
LEUKOCYTES UA: NEGATIVE
NITRITE: NEGATIVE
PROTEIN: NEGATIVE mg/dL
Specific Gravity, Urine: 1.015 (ref 1.005–1.030)
pH: 7.5 (ref 5.0–8.0)

## 2016-03-17 LAB — URINE MICROSCOPIC-ADD ON

## 2016-03-17 LAB — PREGNANCY, URINE: Preg Test, Ur: NEGATIVE

## 2016-03-17 LAB — CBC WITH DIFFERENTIAL/PLATELET
BASOS ABS: 0 10*3/uL (ref 0.0–0.1)
Basophils Relative: 1 %
Eosinophils Absolute: 0.3 10*3/uL (ref 0.0–0.7)
Eosinophils Relative: 3 %
HEMATOCRIT: 42.2 % (ref 36.0–46.0)
Hemoglobin: 14.3 g/dL (ref 12.0–15.0)
LYMPHS PCT: 36 %
Lymphs Abs: 3.1 10*3/uL (ref 0.7–4.0)
MCH: 31.8 pg (ref 26.0–34.0)
MCHC: 33.9 g/dL (ref 30.0–36.0)
MCV: 94 fL (ref 78.0–100.0)
MONO ABS: 0.5 10*3/uL (ref 0.1–1.0)
MONOS PCT: 6 %
NEUTROS ABS: 4.6 10*3/uL (ref 1.7–7.7)
Neutrophils Relative %: 54 %
Platelets: 234 10*3/uL (ref 150–400)
RBC: 4.49 MIL/uL (ref 3.87–5.11)
RDW: 15.4 % (ref 11.5–15.5)
WBC: 8.6 10*3/uL (ref 4.0–10.5)

## 2016-03-17 LAB — LIPASE, BLOOD: Lipase: 32 U/L (ref 11–51)

## 2016-03-17 MED ORDER — DICYCLOMINE HCL 20 MG PO TABS: 20.0000 mg | ORAL_TABLET | Freq: Four times a day (QID) | ORAL | Status: DC | PRN

## 2016-03-17 MED ORDER — ONDANSETRON HCL 4 MG PO TABS: 4.0000 mg | ORAL_TABLET | Freq: Three times a day (TID) | ORAL | Status: DC | PRN

## 2016-03-17 MED ORDER — SODIUM CHLORIDE 0.9 % IV BOLUS (SEPSIS)
1000.0000 mL | Freq: Once | INTRAVENOUS | Status: AC
  Administered 2016-03-17: 1000 mL via INTRAVENOUS

## 2016-03-17 MED ORDER — ONDANSETRON HCL 4 MG/2ML IJ SOLN
4.0000 mg | INTRAMUSCULAR | Status: DC | PRN
  Administered 2016-03-17: 4 mg via INTRAVENOUS
  Filled 2016-03-17: qty 2

## 2016-03-17 MED ORDER — SODIUM CHLORIDE 0.9 % IV SOLN
INTRAVENOUS | Status: DC
  Administered 2016-03-17: 12:00:00 via INTRAVENOUS

## 2016-03-17 MED ORDER — FAMOTIDINE 20 MG PO TABS: 20.0000 mg | ORAL_TABLET | Freq: Two times a day (BID) | ORAL | Status: DC

## 2016-03-17 MED ORDER — DICYCLOMINE HCL 10 MG/ML IM SOLN
20.0000 mg | Freq: Once | INTRAMUSCULAR | Status: AC
  Administered 2016-03-17: 20 mg via INTRAMUSCULAR
  Filled 2016-03-17: qty 2

## 2016-03-17 MED ORDER — FAMOTIDINE IN NACL 20-0.9 MG/50ML-% IV SOLN
20.0000 mg | Freq: Once | INTRAVENOUS | Status: AC
  Administered 2016-03-17: 20 mg via INTRAVENOUS
  Filled 2016-03-17: qty 50

## 2016-03-17 MED ORDER — IOPAMIDOL (ISOVUE-300) INJECTION 61%
100.0000 mL | Freq: Once | INTRAVENOUS | Status: AC | PRN
  Administered 2016-03-17: 100 mL via INTRAVENOUS

## 2016-03-17 MED ORDER — DIATRIZOATE MEGLUMINE & SODIUM 66-10 % PO SOLN
ORAL | Status: AC
  Filled 2016-03-17: qty 60

## 2016-03-17 NOTE — Discharge Instructions (Signed)
Eat a bland diet, avoiding greasy, fatty, fried foods, as well as spicy and acidic foods or beverages.  Avoid eating within the hour or 2 before going to bed or laying down.  Also avoid teas, colas, coffee, chocolate, pepermint and spearment. Take the prescriptions as directed.  Your CT scan showed an incidental finding of: "right lower lobe lung nodule," "hepatomegaly" (enlarged liver), and "benign right adrenal adenoma." Call your regular medical doctor tomorrow to schedule a follow up appointment this week for these findings.  Call the GI doctor tomorrow to schedule a follow up appointment within the next week. Return to the Emergency Department immediately if worsening.

## 2016-03-17 NOTE — ED Notes (Signed)
Patient c/o constant nausea with intermittent dizziness x2 weeks. Per patient improves with laying down. Patient states that she has had intermittent nausea and dizziness x1 year but has never been treated for it. Patient states "It only usually last about 2 days and goes away on it's own." Per patient nausea appeared before dizziness. Patient denies any abd pain but states "my stomach has been bubbling."

## 2016-03-17 NOTE — ED Provider Notes (Signed)
CSN: [id number]     Arrival date & time 03/17/16  1103 History   First MD Initiated Contact with Patient 03/17/16 1109     Chief Complaint  Patient presents with  . Nausea  . Abdominal Pain      HPI  Pt was seen at 1120.  Per pt, c/o gradual onset and persistence of waxing and waning multiple symptoms for the past 1 year. Pt's symptoms include: upper abd "pain," "stomch bubbling," nausea, diarrhea, and feeling "lightheaded." Pt states her symptoms usually last for 2 days before resolving, but currently have been present for the past 2 weeks. Pt states she has not been evaluated for her symptoms previously. Denies back pain, no CP/palpitations, no SOB/cough, no vomiting, no black or blood in stools, no fevers, no rash, no focal motor weakness, no tingling/numbness in extremities.    Past Medical History  Diagnosis Date  . Thyroid disease     hypothyroidism   Past Surgical History  Procedure Laterality Date  . Appendectomy    . Cesarean section     Family History  Problem Relation Age of Onset  . Hypertension Mother   . Diabetes Mother   . COPD Mother   . COPD Father   . Diabetes Brother   . Thyroid disease Brother     hypothyroidism due to treatment for hyperthyroidism   Social History  Substance Use Topics  . Smoking status: Current Every Day Smoker -- 1.00 packs/day for 10 years    Types: Cigarettes  . Smokeless tobacco: Never Used  . Alcohol Use: No   OB History    Gravida Para Term Preterm AB TAB SAB Ectopic Multiple Living   5 3 3  2  2   3      Review of Systems ROS: Statement: All systems negative except as marked or noted in the HPI; Constitutional: Negative for fever and chills. ; ; Eyes: Negative for eye pain, redness and discharge. ; ; ENMT: Negative for ear pain, hoarseness, nasal congestion, sinus pressure and sore throat. ; ; Cardiovascular: Negative for chest pain, palpitations, diaphoresis, dyspnea and peripheral edema. ; ; Respiratory: Negative for cough,  wheezing and stridor. ; ; Gastrointestinal: +nausea, diarrhea, abd pain. Negative for vomiting, blood in stool, hematemesis, jaundice and rectal bleeding. . ; ; Genitourinary: Negative for dysuria, flank pain and hematuria. ; ; Musculoskeletal: Negative for back pain and neck pain. Negative for swelling and trauma.; ; Skin: Negative for pruritus, rash, abrasions, blisters, bruising and skin lesion.; ; Neuro: +lightheadedness. Negative for headache and neck stiffness. Negative for weakness, altered level of consciousness, altered mental status, extremity weakness, paresthesias, involuntary movement, seizure and syncope.      Allergies  Penicillins  Home Medications   Prior to Admission medications   Medication Sig Start Date End Date Taking? Authorizing Provider  azithromycin (AZASITE) 1 % ophthalmic solution Place 1 drop into the left eye daily. 2 on first day and then one daily for 5 days 01/17/16   Fransisca Kaufmann Dettinger, MD  ciprofloxacin (CILOXAN) 0.3 % ophthalmic solution Place 2 drops into the left eye every 4 (four) hours while awake. 01/17/16   Fransisca Kaufmann Dettinger, MD  levothyroxine (SYNTHROID, LEVOTHROID) 175 MCG tablet TAKE ONE TABLET BY MOUTH ONCE DAILY BEFORE BREAKFAST 02/19/16   Fransisca Kaufmann Dettinger, MD   BP 105/54 mmHg  Pulse 80  Temp(Src) 98.7 F (37.1 C) (Oral)  Resp 16  Ht 5\' 7"  (1.702 m)  Wt 260 lb (117.935 kg)  BMI 40.71  kg/m2  SpO2 98%  LMP 03/16/2016   11:24:09 Orthostatic Vital Signs TV  Orthostatic Lying  - BP- Lying: 106/64 mmHg ; Pulse- Lying: 80  Orthostatic Sitting - BP- Sitting: 119/50 mmHg ; Pulse- Sitting: 84  Orthostatic Standing at 0 minutes - BP- Standing at 0 minutes: 109/57 mmHg ; Pulse- Standing at 0 minutes: 96       Physical Exam 1125: Physical examination:  Nursing notes reviewed; Vital signs and O2 SAT reviewed;  Constitutional: Well developed, Well nourished, Well hydrated, In no acute distress; Head:  Normocephalic, atraumatic; Eyes: EOMI, PERRL, No  scleral icterus; ENMT: Mouth and pharynx normal, Mucous membranes moist; Neck: Supple, Full range of motion, No lymphadenopathy; Cardiovascular: Regular rate and rhythm, No murmur, rub, or gallop; Respiratory: Breath sounds clear & equal bilaterally, No rales, rhonchi, wheezes.  Speaking full sentences with ease, Normal respiratory effort/excursion; Chest: Nontender, Movement normal; Abdomen: Soft, +mid-epigastric > generalized tenderness to palp. No rebound or guarding. Nondistended, Normal bowel sounds; Genitourinary: No CVA tenderness; Extremities: Pulses normal, No tenderness, No edema, No calf edema or asymmetry.; Neuro: AA&Ox3, Major CN grossly intact.  Speech clear. No gross focal motor or sensory deficits in extremities.; Skin: Color normal, Warm, Dry.   ED Course  Procedures (including critical care time) Labs Review  Imaging Review  I have personally reviewed and evaluated these images and lab results as part of my medical decision-making.   EKG Interpretation   Date/Time:  Sunday March 17 2016 11:22:49 EDT Ventricular Rate:  77 PR Interval:    QRS Duration: 88 QT Interval:  355 QTC Calculation: 402 R Axis:   30 Text Interpretation:  Sinus rhythm Abnormal R-wave progression, early  transition No old tracing to compare Confirmed by [facility]  MD, Nunzio Cory  [phone]) on 03/17/2016 11:25:54 AM      MDM  MDM Reviewed: previous chart, nursing note and vitals Reviewed previous: labs Interpretation: labs, x-ray and CT scan      Results for orders placed or performed during the hospital encounter of 03/17/16  Pregnancy, urine  Result Value Ref Range   Preg Test, Ur NEGATIVE NEGATIVE  Urinalysis, Routine w reflex microscopic  Result Value Ref Range   Color, Urine YELLOW YELLOW   APPearance CLEAR CLEAR   Specific Gravity, Urine 1.015 1.005 - 1.030   pH 7.5 5.0 - 8.0   Glucose, UA NEGATIVE NEGATIVE mg/dL   Hgb urine dipstick TRACE (A) NEGATIVE   Bilirubin Urine NEGATIVE  NEGATIVE   Ketones, ur NEGATIVE NEGATIVE mg/dL   Protein, ur NEGATIVE NEGATIVE mg/dL   Nitrite NEGATIVE NEGATIVE   Leukocytes, UA NEGATIVE NEGATIVE  Comprehensive metabolic panel  Result Value Ref Range   Sodium 136 135 - 145 mmol/L   Potassium 4.0 3.5 - 5.1 mmol/L   Chloride 107 101 - 111 mmol/L   CO2 25 22 - 32 mmol/L   Glucose, Bld 103 (H) 65 - 99 mg/dL   BUN 10 6 - 20 mg/dL   Creatinine, Ser 0.62 0.44 - 1.00 mg/dL   Calcium 8.9 8.9 - 10.3 mg/dL   Total Protein 8.2 (H) 6.5 - 8.1 g/dL   Albumin 3.7 3.5 - 5.0 g/dL   AST 16 15 - 41 U/L   ALT 17 14 - 54 U/L   Alkaline Phosphatase 82 38 - 126 U/L   Total Bilirubin 0.2 (L) 0.3 - 1.2 mg/dL   GFR calc non Af Amer >60 >60 mL/min   GFR calc Af Amer >60 >60 mL/min   Anion  gap 4 (L) 5 - 15  Lipase, blood  Result Value Ref Range   Lipase 32 11 - 51 U/L  CBC with Differential  Result Value Ref Range   WBC 8.6 4.0 - 10.5 K/uL   RBC 4.49 3.87 - 5.11 MIL/uL   Hemoglobin 14.3 12.0 - 15.0 g/dL   HCT 42.2 36.0 - 46.0 %   MCV 94.0 78.0 - 100.0 fL   MCH 31.8 26.0 - 34.0 pg   MCHC 33.9 30.0 - 36.0 g/dL   RDW 15.4 11.5 - 15.5 %   Platelets 234 150 - 400 K/uL   Neutrophils Relative % 54 %   Neutro Abs 4.6 1.7 - 7.7 K/uL   Lymphocytes Relative 36 %   Lymphs Abs 3.1 0.7 - 4.0 K/uL   Monocytes Relative 6 %   Monocytes Absolute 0.5 0.1 - 1.0 K/uL   Eosinophils Relative 3 %   Eosinophils Absolute 0.3 0.0 - 0.7 K/uL   Basophils Relative 1 %   Basophils Absolute 0.0 0.0 - 0.1 K/uL  Urine microscopic-add on  Result Value Ref Range   Squamous Epithelial / LPF 0-5 (A) NONE SEEN   WBC, UA 0-5 0 - 5 WBC/hpf   RBC / HPF 0-5 0 - 5 RBC/hpf   Bacteria, UA RARE (A) NONE SEEN   Dg Chest 2 View 03/17/2016  CLINICAL DATA:  Abdominal pain, nausea and diarrhea peer EXAM: CHEST  2 VIEW COMPARISON:  None. FINDINGS: Normal heart size. No pleural effusion or edema. No airspace consolidation identified. The visualized osseous structures are significant for  mild thoracic spondylosis. IMPRESSION: 1. No acute cardiopulmonary abnormality. Electronically Signed   By: Kerby Moors M.D.   On: 03/17/2016 12:25   Ct Abdomen Pelvis W Contrast 03/17/2016  CLINICAL DATA:  Abdominal pain and nausea, diarrhea. Constant symptoms for 2 weeks. Improves when lying down. EXAM: CT ABDOMEN AND PELVIS WITH CONTRAST TECHNIQUE: Multidetector CT imaging of the abdomen and pelvis was performed using the standard protocol following bolus administration of intravenous contrast. CONTRAST:  19mL ISOVUE-300 IOPAMIDOL (ISOVUE-300) INJECTION 61% COMPARISON:  None. FINDINGS: Lower chest: Scattered emphysematous changes are identified in the lung bases. Within the right lower lobe there is a 7 x 5 mm nodule best seen on image 6 of series 6 with a pleural base location. No focal consolidations or pleural effusions are identified. The Heart size is normal. No imaged pericardial effusion or significant coronary artery calcifications. Hepatobiliary: No focal liver lesions are identified. Liver is mildly enlarged. Pancreas: Unremarkable. Spleen: Unremarkable. Renal/Adrenal: Within the lateral limb of the right adrenal gland there is an enhancing mass measuring 3.1 x 3.3 cm. Given the washout type enhancement this is consistent with a benign adenoma in no further evaluation is felt to be necessary. The left adrenal gland is normal in appearance. There is symmetric enhancement and early excretion from both kidneys. No hydronephrosis or focal renal mass identified. Gastrointestinal tract: The stomach and small bowel loops are normal in appearance. Previous appendectomy. Colonic loops are normal in appearance. Reproductive/Pelvis: Uterus is present. No adnexal mass. No free pelvic fluid. Vascular/Lymphatic: No retroperitoneal or mesenteric adenopathy. Mild atherosclerosis of the abdominal aorta. Musculoskeletal/Abdominal wall: Abdominal wall is unremarkable in appearance. Other: Unremarkable. IMPRESSION:  1. Hepatomegaly without focal liver lesions. 2. Benign right adrenal adenoma. 3. 6 mm right lower lobe nodule. Non-contrast chest CT at 6-12 months is recommended. If the nodule is stable at time of repeat CT, then future CT at 18-24 months (from today's scan)  is considered optional for low-risk patients, but is recommended for high-risk patients. This recommendation follows the consensus statement: Guidelines for Management of Incidental Pulmonary Nodules Detected on CT Images:From the Fleischner Society 2017; published online before print (10.1148/radiol.IJ:2314499). 4. Prior appendectomy. Electronically Signed   By: Nolon Nations M.D.   On: 03/17/2016 12:42     1410:  EPIC chart reviewed: pt with chronically low BP's; pt confirms this. BP's today c/w previous on file. CT A/P results did not cross over into EPIC; results as above. Workup reassuring. Has tol PO well while in the ED without N/V. No stooling while in the ED. Feels better after meds and wants to go home now. Tx symptomatically, f/u GI MD. Dx and testing d/w pt and family.  Questions answered.  Verb understanding, agreeable to d/c home with outpt f/u.   Francine Graven, DO 03/20/16 1626

## 2016-03-20 ENCOUNTER — Encounter: Payer: Self-pay | Admitting: Pediatrics

## 2016-03-20 ENCOUNTER — Ambulatory Visit (INDEPENDENT_AMBULATORY_CARE_PROVIDER_SITE_OTHER): Payer: 59 | Admitting: Pediatrics

## 2016-03-20 VITALS — BP 104/73 | HR 81 | Temp 97.8°F | Ht 67.0 in | Wt 272.2 lb

## 2016-03-20 DIAGNOSIS — R1013 Epigastric pain: Secondary | ICD-10-CM | POA: Diagnosis not present

## 2016-03-20 DIAGNOSIS — R140 Abdominal distension (gaseous): Secondary | ICD-10-CM | POA: Diagnosis not present

## 2016-03-20 DIAGNOSIS — E039 Hypothyroidism, unspecified: Secondary | ICD-10-CM | POA: Diagnosis not present

## 2016-03-20 DIAGNOSIS — R911 Solitary pulmonary nodule: Secondary | ICD-10-CM | POA: Diagnosis not present

## 2016-03-20 DIAGNOSIS — R195 Other fecal abnormalities: Secondary | ICD-10-CM

## 2016-03-20 DIAGNOSIS — D3501 Benign neoplasm of right adrenal gland: Secondary | ICD-10-CM | POA: Diagnosis not present

## 2016-03-20 DIAGNOSIS — Z720 Tobacco use: Secondary | ICD-10-CM | POA: Diagnosis not present

## 2016-03-20 DIAGNOSIS — D3500 Benign neoplasm of unspecified adrenal gland: Secondary | ICD-10-CM | POA: Insufficient documentation

## 2016-03-20 DIAGNOSIS — H8111 Benign paroxysmal vertigo, right ear: Secondary | ICD-10-CM | POA: Diagnosis not present

## 2016-03-20 MED ORDER — PROMETHAZINE HCL 12.5 MG PO TABS: 12.5000 mg | ORAL_TABLET | Freq: Three times a day (TID) | ORAL | Status: DC | PRN

## 2016-03-20 MED ORDER — OMEPRAZOLE 20 MG PO CPDR: 20.0000 mg | DELAYED_RELEASE_CAPSULE | Freq: Every day | ORAL | Status: AC

## 2016-03-20 MED ORDER — MECLIZINE HCL 12.5 MG PO TABS: 12.5000 mg | ORAL_TABLET | Freq: Three times a day (TID) | ORAL | Status: DC | PRN

## 2016-03-20 NOTE — Patient Instructions (Addendum)
Will check stool for parasites Try meclizine for vertigo, can take 3 times a day Promethazine for nausea, take 1-2 tabs twice a day as needed We need to recheck your thyroid levels as thyroid levels were very low last we checked  Epley Maneuver Self-Care WHAT IS THE EPLEY MANEUVER? The Epley maneuver is an exercise you can do to relieve symptoms of benign paroxysmal positional vertigo (BPPV). This condition is often just referred to as vertigo. BPPV is caused by the movement of tiny crystals (canaliths) inside your inner ear. The accumulation and movement of canaliths in your inner ear causes a sudden spinning sensation (vertigo) when you move your head to certain positions. Vertigo usually lasts about 30 seconds. BPPV usually occurs in just one ear. If you get vertigo when you lie on your left side, you probably have BPPV in your left ear. Your health care provider can tell you which ear is involved.  BPPV may be caused by a head injury. Many people older than 50 get BPPV for unknown reasons. If you have been diagnosed with BPPV, your health care provider may teach you how to do this maneuver. BPPV is not life threatening (benign) and usually goes away in time.  WHEN SHOULD I PERFORM THE EPLEY MANEUVER? You can do this maneuver at home whenever you have symptoms of vertigo. You may do the Epley maneuver up to 3 times a day until your symptoms of vertigo go away. HOW SHOULD I DO THE EPLEY MANEUVER? 1. Sit on the edge of a bed or table with your back straight. Your legs should be extended or hanging over the edge of the bed or table.  2. Turn your head halfway toward the affected ear.  3. Lie backward quickly with your head turned until you are lying flat on your back. You may want to position a pillow under your shoulders.  4. Hold this position for 30 seconds. You may experience an attack of vertigo. This is normal. Hold this position until the vertigo stops. 5. Then turn your head to the  opposite direction until your unaffected ear is facing the floor.  6. Hold this position for 30 seconds. You may experience an attack of vertigo. This is normal. Hold this position until the vertigo stops. 7. Now turn your whole body to the same side as your head. Hold for another 30 seconds.  8. You can then sit back up. ARE THERE RISKS TO THIS MANEUVER? In some cases, you may have other symptoms (such as changes in your vision, weakness, or numbness). If you have these symptoms, stop doing the maneuver and call your health care provider. Even if doing these maneuvers relieves your vertigo, you may still have dizziness. Dizziness is the sensation of light-headedness but without the sensation of movement. Even though the Epley maneuver may relieve your vertigo, it is possible that your symptoms will return within 5 years. WHAT SHOULD I DO AFTER THIS MANEUVER? After doing the Epley maneuver, you can return to your normal activities. Ask your doctor if there is anything you should do at home to prevent vertigo. This may include:  Sleeping with two or more pillows to keep your head elevated.  Not sleeping on the side of your affected ear.  Getting up slowly from bed.  Avoiding sudden movements during the day.  Avoiding extreme head movement, like looking up or bending over.  Wearing a cervical collar to prevent sudden head movements. WHAT SHOULD I DO IF MY SYMPTOMS GET  WORSE? Call your health care provider if your vertigo gets worse. Call your provider right way if you have other symptoms, including:   Nausea.  Vomiting.  Headache.  Weakness.  Numbness.  Vision changes.   This information is not intended to replace advice given to you by your health care provider. Make sure you discuss any questions you have with your health care provider.   Document Released: 08/24/2013 Document Reviewed: 08/24/2013 Elsevier Interactive Patient Education [employer].

## 2016-03-20 NOTE — Progress Notes (Signed)
Subjective:    Patient ID: Haley Keith, female    DOB: 03-21-1971, 45 y.o.   MRN: [id number]  CC: hospital f/u  HPI: Haley Keith is a 45 y.o. female presenting for Hospitalization Follow-up   Seen in the ED for nausea. Started 2.5 weeks ago, had episode of lightheadedness. Was ok for a week. Started again 2 weeks ago and now has been constant. Appetite has been down, feels full and bloated most of the time, then will feel starving and eat a lot, feel bloated afterwards. Sometimes with reflux. Sometimes with epigastric "tenderness" denies abd pain. Everything feels bloated, pants feeling tighter than  Anti-emetics and famotidine started three days ago havent helped Drinking throughout the day Drinks water and sweet tea at work [employer] like the room is spinning at times Has been stumbling some, no falls Has been taking thyroid medicine daily  I have reviewed the hospital records and labs.  Depression screen [facility] 2/9 03/20/2016 01/17/2016 10/25/2015 09/10/2014 04/05/2014  Decreased Interest 0 0 0 0 0  Down, Depressed, Hopeless 0 0 0 0 0  PHQ - 2 Score 0 0 0 0 0     Relevant past medical, surgical, family and social history reviewed and updated as indicated.  Interim medical history since our last visit reviewed. Allergies and medications reviewed and updated.  ROS: All systems negative other than what is in HPI  History  Smoking status  . Current Every Day Smoker -- 1.00 packs/day for 10 years  . Types: Cigarettes  Smokeless tobacco  . Never Used       Objective:    BP 104/73 mmHg  Pulse 81  Temp(Src) 97.8 F (36.6 C) (Oral)  Ht 5\' 7"  (1.702 m)  Wt 272 lb 3.2 oz (123.469 kg)  BMI 42.62 kg/m2  LMP 03/16/2016  Wt Readings from Last 3 Encounters:  03/20/16 272 lb 3.2 oz (123.469 kg)  03/17/16 260 lb (117.935 kg)  01/17/16 268 lb 9.6 oz (121.836 kg)    Orthostatics normal  Gen: NAD, alert, cooperative with exam, NCAT EYES: EOMI, no scleral injection or  icterus ENT: OP without erythema CV: NRRR, normal S1/S2 Resp: CTABL, no wheezes, normal WOB Abd: +BS, soft, no tenderness with palpation, ND. no guarding or organomegaly Ext: No edema, warm Neuro: Alert and oriented MSK: normal muscle bulk     Assessment & Plan:   Haley Keith is a 45 y.o. female presenting for hospital follow up. I have reviewed the hospital records including labs and pertinent imaging.  Haley Keith was seen today for hospitalization follow-up and nausea.  Diagnoses and all orders for this visit:  Tobacco abuse Cont to encourage cessation  Lung nodule incidental finding on CT scan, needs f/u in 6-12 mo repeat scan, then in 18 mo from now given smoking history  Adrenal adenoma Less than 4cm, consistent with benign adenoma, no f/u needed  Benign paroxysmal positional vertigo, right epley maneuvers at home -     promethazine (PHENERGAN) 12.5 MG tablet; Take 1-2 tablets (12.5-25 mg total) by mouth every 8 (eight) hours as needed for nausea or vomiting. -     meclizine (ANTIVERT) 12.5 MG tablet; Take 1 tablet (12.5 mg total) by mouth 3 (three) times daily as needed for dizziness.  Hypothyroidism, unspecified hypothyroidism type Overdue for recheck TSH, was 30 off of meds 4 months ago, possible contributing to current symptoms -     TSH  Loose stools -     Ova and parasite examination  Abdominal  bloating -     Ova and parasite examination  Abdominal pain, epigastric -     omeprazole (PRILOSEC) 20 MG capsule; Take 1 capsule (20 mg total) by mouth daily.    Follow up plan: Return in about 2 weeks (around 04/03/2016).  Assunta Found, MD [facility] 03/20/2016, 8:55 AM

## 2016-03-21 ENCOUNTER — Other Ambulatory Visit: Payer: 59

## 2016-03-21 LAB — TSH: TSH: 8.37 u[IU]/mL — ABNORMAL HIGH (ref 0.450–4.500)

## 2016-03-22 MED ORDER — LEVOTHYROXINE SODIUM 200 MCG PO TABS: 200.0000 ug | ORAL_TABLET | Freq: Every day | ORAL | Status: DC

## 2016-03-22 NOTE — Addendum Note (Signed)
Addended by: Eustaquio Maize on: 03/22/2016 01:25 PM   Modules accepted: Orders

## 2016-03-27 LAB — OVA AND PARASITE EXAMINATION

## 2016-03-29 ENCOUNTER — Other Ambulatory Visit: Payer: Self-pay | Admitting: Pediatrics

## 2016-03-29 DIAGNOSIS — R1013 Epigastric pain: Secondary | ICD-10-CM

## 2016-03-29 MED ORDER — ONDANSETRON HCL 4 MG PO TABS: 4.0000 mg | ORAL_TABLET | Freq: Three times a day (TID) | ORAL | 0 refills | Status: DC | PRN

## 2016-03-29 NOTE — Telephone Encounter (Signed)
Please address

## 2016-03-29 NOTE — Telephone Encounter (Signed)
Sent in refill. If symptoms worsening or still ongoing, needs to be seen.

## 2016-03-30 ENCOUNTER — Other Ambulatory Visit: Payer: Self-pay | Admitting: *Deleted

## 2016-03-30 DIAGNOSIS — R1013 Epigastric pain: Secondary | ICD-10-CM

## 2016-03-30 MED ORDER — ONDANSETRON HCL 4 MG PO TABS: 4.0000 mg | ORAL_TABLET | Freq: Three times a day (TID) | ORAL | 0 refills | Status: DC | PRN

## 2016-03-30 NOTE — Telephone Encounter (Signed)
Left message stating that medication refill has been sent to the pharmacy and to call back with any questions or concerns

## 2016-04-15 ENCOUNTER — Other Ambulatory Visit: Payer: Self-pay | Admitting: Pediatrics

## 2016-04-15 DIAGNOSIS — H8111 Benign paroxysmal vertigo, right ear: Secondary | ICD-10-CM

## 2016-04-15 MED ORDER — PROMETHAZINE HCL 12.5 MG PO TABS: 12.5000 mg | ORAL_TABLET | Freq: Three times a day (TID) | ORAL | 0 refills | Status: DC | PRN

## 2016-04-15 NOTE — Telephone Encounter (Signed)
Patient aware medication has been sent to pharmacy.

## 2016-04-22 ENCOUNTER — Encounter: Payer: Self-pay | Admitting: Internal Medicine

## 2016-04-22 ENCOUNTER — Encounter: Payer: Self-pay | Admitting: Pediatrics

## 2016-04-22 ENCOUNTER — Ambulatory Visit (INDEPENDENT_AMBULATORY_CARE_PROVIDER_SITE_OTHER): Payer: 59 | Admitting: Pediatrics

## 2016-04-22 VITALS — BP 110/74 | HR 76 | Temp 98.1°F | Ht 67.0 in | Wt 271.8 lb

## 2016-04-22 DIAGNOSIS — R110 Nausea: Secondary | ICD-10-CM

## 2016-04-22 DIAGNOSIS — Z720 Tobacco use: Secondary | ICD-10-CM | POA: Diagnosis not present

## 2016-04-22 DIAGNOSIS — R6881 Early satiety: Secondary | ICD-10-CM | POA: Insufficient documentation

## 2016-04-22 DIAGNOSIS — N939 Abnormal uterine and vaginal bleeding, unspecified: Secondary | ICD-10-CM

## 2016-04-22 DIAGNOSIS — Z1239 Encounter for other screening for malignant neoplasm of breast: Secondary | ICD-10-CM

## 2016-04-22 NOTE — Progress Notes (Signed)
  Subjective:   Patient ID: Haley Keith, female    DOB: 1971/06/26, 45 y.o.   MRN: [id number] CC: Nausea (has been ongoing since she saw you in July)  HPI: Haley Keith is a 45 y.o. female presenting for Nausea (has been ongoing since she saw you in July)  Nauseous most days in the morning Dizziness still present Feels bloated and full, doesn't want to eat No throwing up at all Gets sick when she eats Having loose stools at times when she goes, doesn't go more often than once a day  Started period about 2 weeks ago Was heavier than usual, lasted about 7 days, heavy bleeding for 5 days Cramping, pain Spotting again starting 2 days ago, sometimes as heavy as regular period, not always No missed periods   Relevant past medical, surgical, family and social history reviewed. Allergies and medications reviewed and updated. History  Smoking Status  . Current Every Day Smoker  . Packs/day: 1.00  . Years: 10.00  . Types: Cigarettes  Smokeless Tobacco  . Never Used   ROS: Per HPI   Objective:    BP 110/74 (BP Location: Left Arm, Patient Position: Sitting, Cuff Size: Large)   Pulse 76   Temp 98.1 F (36.7 C) (Oral)   Ht '5\' 7"'$  (1.702 m)   Wt 271 lb 12.8 oz (123.3 kg)   BMI 42.57 kg/m   Wt Readings from Last 3 Encounters:  04/22/16 271 lb 12.8 oz (123.3 kg)  03/20/16 272 lb 3.2 oz (123.5 kg)  03/17/16 260 lb (117.9 kg)     Gen: NAD, alert, cooperative with exam, NCAT EYES: EOMI, no conjunctival injection, or no icterus ENT:  TMs pearly gray b/l, OP without erythema LYMPH: no cervical LAD NECK: normal thyroid CV: NRRR, normal S1/S2, no murmur, distal pulses 2+ b/l Resp: CTABL, no wheezes, normal WOB Abd: +BS, soft, nauseous with epigastric pressure, mildly tender LUQ, obese. no guarding or organomegaly Ext: No edema, warm Neuro: Alert and oriented, strength equal b/l UE and LE, coordination grossly normal MSK: normal muscle bulk  Assessment & Plan:  Haley Keith was seen  today for nausea.  Diagnoses and all orders for this visit:  Nausea without vomiting and Early satiety Has been on PPI, famotidine CT scan with no obvious cause 4 weeks ago Symptoms ongoing, poor appetite Will recheck labs today Will refer to GI for EGD -     Ambulatory referral to Gastroenterology -     CMP14+EGFR -     Lipase  Morbid obesity, unspecified obesity type ([place]) Continue lifestyle changes, minimal appetite due to above problems  Tobacco abuse Ongoing, pre-contemplative  Screening for breast cancer -     MM DIGITAL SCREENING BILATERAL; Future  Abnormal uterine bleeding Recheck TSH in 4 weeks, next appt May be start of menopause  Follow up plan: Return in about 4 weeks (around 05/20/2016) for pap smear. Assunta Found, MD [place]

## 2016-04-23 LAB — CMP14+EGFR
ALBUMIN: 3.6 g/dL (ref 3.5–5.5)
ALK PHOS: 78 IU/L (ref 39–117)
ALT: 11 IU/L (ref 0–32)
AST: 10 IU/L (ref 0–40)
Albumin/Globulin Ratio: 1 — ABNORMAL LOW (ref 1.2–2.2)
BUN/Creatinine Ratio: 14 (ref 9–23)
BUN: 9 mg/dL (ref 6–24)
CHLORIDE: 101 mmol/L (ref 96–106)
CO2: 24 mmol/L (ref 18–29)
CREATININE: 0.64 mg/dL (ref 0.57–1.00)
Calcium: 9.1 mg/dL (ref 8.7–10.2)
GFR calc Af Amer: 123 mL/min/{1.73_m2} (ref >59)
GFR calc non Af Amer: 107 mL/min/{1.73_m2} (ref >59)
GLUCOSE: 95 mg/dL (ref 65–99)
Globulin, Total: 3.6 g/dL (ref 1.5–4.5)
POTASSIUM: 5 mmol/L (ref 3.5–5.2)
SODIUM: 140 mmol/L (ref 134–144)
Total Protein: 7.2 g/dL (ref 6.0–8.5)

## 2016-04-23 LAB — LIPASE: LIPASE: 41 U/L (ref 0–59)

## 2016-04-26 ENCOUNTER — Telehealth: Payer: Self-pay | Admitting: Pediatrics

## 2016-04-30 ENCOUNTER — Other Ambulatory Visit (HOSPITAL_COMMUNITY)
Admission: RE | Admit: 2016-04-30 | Discharge: 2016-04-30 | Disposition: A | Discharge status: Home / Self Care | Payer: 59 | Source: Ambulatory Visit | Attending: Obstetrics & Gynecology | Admitting: Obstetrics & Gynecology

## 2016-04-30 ENCOUNTER — Ambulatory Visit (INDEPENDENT_AMBULATORY_CARE_PROVIDER_SITE_OTHER): Payer: 59 | Admitting: Obstetrics & Gynecology

## 2016-04-30 ENCOUNTER — Encounter: Payer: Self-pay | Admitting: Obstetrics & Gynecology

## 2016-04-30 VITALS — BP 130/80 | HR 78 | Ht 67.0 in | Wt 269.0 lb

## 2016-04-30 DIAGNOSIS — Z1151 Encounter for screening for human papillomavirus (HPV): Secondary | ICD-10-CM | POA: Insufficient documentation

## 2016-04-30 DIAGNOSIS — Z01419 Encounter for gynecological examination (general) (routine) without abnormal findings: Secondary | ICD-10-CM

## 2016-04-30 NOTE — Progress Notes (Signed)
Subjective:     Haley Keith is a 45 y.o. female here for a routine exam.  Patient's last menstrual period was 04/09/2016. [id number] Birth Control Method:  BTL  Menstrual Calendar(currently): generally regular, began spotting midcycle with this period  Current complaints: bloating and nausea since July 3rd, no emesis, pt states lab work up negative.   Current acute medical issues:  CT scan   Recent Gynecologic History Patient's last menstrual period was 04/09/2016. Last Pap: 6-7 years ago,  normal Last mammogram: ordered,    Past Medical History:  Diagnosis Date  . Thyroid disease    hypothyroidism    Past Surgical History:  Procedure Laterality Date  . APPENDECTOMY    . CESAREAN SECTION      OB History    Gravida Para Term Preterm AB Living   5 3 3   2 3    SAB TAB Ectopic Multiple Live Births   2              Social History   Social History  . Marital status: Married    Spouse name: N/A  . Number of children: N/A  . Years of education: N/A   Social History Main Topics  . Smoking status: Current Every Day Smoker    Packs/day: 1.00    Years: 10.00    Types: Cigarettes  . Smokeless tobacco: Never Used  . Alcohol use No  . Drug use: No  . Sexual activity: Yes    Birth control/ protection: None   Other Topics Concern  . None   Social History Narrative  . None    Family History  Problem Relation Age of Onset  . Hypertension Mother   . Diabetes Mother   . COPD Mother   . Cancer - Cervical Mother   . COPD Father   . Diabetes Brother   . Thyroid disease Brother     hypothyroidism due to treatment for hyperthyroidism  . Cancer - Cervical Sister      Current Outpatient Prescriptions:  .  levothyroxine (SYNTHROID) 200 MCG tablet, Take 1 tablet (200 mcg total) by mouth daily before breakfast., Disp: 30 tablet, Rfl: 2 .  meclizine (ANTIVERT) 12.5 MG tablet, Take 1 tablet (12.5 mg total) by mouth 3 (three) times daily as needed for dizziness., Disp: 30  tablet, Rfl: 0 .  omeprazole (PRILOSEC) 20 MG capsule, Take 1 capsule (20 mg total) by mouth daily., Disp: 30 capsule, Rfl: 3 .  promethazine (PHENERGAN) 12.5 MG tablet, Take 1-2 tablets (12.5-25 mg total) by mouth every 8 (eight) hours as needed for nausea or vomiting., Disp: 15 tablet, Rfl: 0 .  ranitidine (ZANTAC) 150 MG tablet, Take 150 mg by mouth 2 (two) times daily., Disp: , Rfl:   Review of Systems  Review of Systems  Constitutional: Negative for fever, chills, weight loss, malaise/fatigue and diaphoresis.  HENT: Negative for hearing loss, ear pain, nosebleeds, congestion, sore throat, neck pain, tinnitus and ear discharge.   Eyes: Negative for blurred vision, double vision, photophobia, pain, discharge and redness.  Respiratory: Negative for cough, hemoptysis, sputum production, shortness of breath, wheezing and stridor.   Cardiovascular: Negative for chest pain, palpitations, orthopnea, claudication, leg swelling and PND.  Gastrointestinal: negative for abdominal pain. Negative for heartburn, nausea, vomiting, diarrhea, constipation, blood in stool and melena.  Genitourinary: Negative for dysuria, urgency, frequency, hematuria and flank pain.  Musculoskeletal: Negative for myalgias, back pain, joint pain and falls.  Skin: Negative for itching and rash.  Neurological: Negative  for dizziness, tingling, tremors, sensory change, speech change, focal weakness, seizures, loss of consciousness, weakness and headaches.  Endo/Heme/Allergies: Negative for environmental allergies and polydipsia. Does not bruise/bleed easily.  Psychiatric/Behavioral: Negative for depression, suicidal ideas, hallucinations, memory loss and substance abuse. The patient is not nervous/anxious and does not have insomnia.        Objective:  Blood pressure 130/80, pulse 78, height 5\' 7"  (1.702 m), weight 269 lb (122 kg), last menstrual period 04/09/2016.   Physical Exam  Vitals reviewed. Constitutional: She is  oriented to person, place, and time. She appears well-developed and well-nourished.  HENT:  Head: Normocephalic and atraumatic.        Right Ear: External ear normal.  Left Ear: External ear normal.  Nose: Nose normal.  Mouth/Throat: Oropharynx is clear and moist.  Eyes: Conjunctivae and EOM are normal. Pupils are equal, round, and reactive to light. Right eye exhibits no discharge. Left eye exhibits no discharge. No scleral icterus.  Neck: Normal range of motion. Neck supple. No tracheal deviation present. No thyromegaly present.  Cardiovascular: Normal rate, regular rhythm, normal heart sounds and intact distal pulses.  Exam reveals no gallop and no friction rub.   No murmur heard. Respiratory: Effort normal and breath sounds normal. No respiratory distress. She has no wheezes. She has no rales. She exhibits no tenderness.  GI: Soft. Bowel sounds are normal. She exhibits no distension and no mass. There is no tenderness. There is no rebound and no guarding.  Genitourinary:  Breasts no masses skin changes or nipple changes bilaterally      Vulva is normal without lesions Vagina is pink moist without discharge Cervix normal in appearance and pap is done Uterus is normal size shape and contour Adnexa is negative with normal sized ovaries   Musculoskeletal: Normal range of motion. She exhibits no edema and no tenderness.  Neurological: She is alert and oriented to person, place, and time. She has normal reflexes. She displays normal reflexes. No cranial nerve deficit. She exhibits normal muscle tone. Coordination normal.  Skin: Skin is warm and dry. No rash noted. No erythema. No pallor.  Psychiatric: She has a normal mood and affect. Her behavior is normal. Judgment and thought content normal.       Medications Ordered at today's visit: Meds ordered this encounter  Medications  . ranitidine (ZANTAC) 150 MG tablet    Sig: Take 150 mg by mouth 2 (two) times daily.    Other orders  placed at today's visit: No orders of the defined types were placed in this encounter.     Assessment:    Healthy female exam.    Plan:    Contraception: tubal ligation. Follow up in: prn weeks.     No Follow-up on file.

## 2016-05-02 LAB — CYTOLOGY - PAP

## 2016-05-16 ENCOUNTER — Ambulatory Visit: Payer: 59 | Admitting: Gastroenterology

## 2016-07-17 ENCOUNTER — Telehealth: Payer: Self-pay | Admitting: Pediatrics

## 2016-07-17 MED ORDER — LEVOTHYROXINE SODIUM 175 MCG PO TABS: 175.0000 ug | ORAL_TABLET | Freq: Every day | ORAL | 0 refills | Status: DC

## 2016-07-17 MED ORDER — LEVOTHYROXINE SODIUM 175 MCG PO TABS
175.0000 ug | ORAL_TABLET | Freq: Every day | ORAL | Status: DC
Start: 2016-07-17 — End: 2016-10-18

## 2016-07-17 NOTE — Telephone Encounter (Signed)
Patient aware and rx sent  

## 2016-07-17 NOTE — Telephone Encounter (Signed)
Patient states that levothyroxine 200mg  is too strong and would like to go back to the 175mg . Patient states that when she takes the 200mg  she gets chest pain after taking it, has the shakes all day, trouble sleeping at night and it is giving her constipation. Please advise

## 2016-07-17 NOTE — Telephone Encounter (Signed)
That's fine, take 136mcg daily, we will recheck next visit. If she needs refills OK to send in.

## 2016-10-02 ENCOUNTER — Encounter: Payer: 59 | Admitting: *Deleted

## 2016-10-18 ENCOUNTER — Other Ambulatory Visit: Payer: Self-pay | Admitting: Pediatrics

## 2016-10-18 MED ORDER — LEVOTHYROXINE SODIUM 175 MCG PO TABS: 175.0000 ug | ORAL_TABLET | Freq: Every day | ORAL | 0 refills | Status: AC

## 2016-10-18 NOTE — Telephone Encounter (Signed)
Patient aware that medication has been sent to the pharmacy and will need to be seen for any further refills

## 2016-10-25 ENCOUNTER — Ambulatory Visit (INDEPENDENT_AMBULATORY_CARE_PROVIDER_SITE_OTHER): Payer: 59 | Admitting: Family Medicine

## 2016-10-25 ENCOUNTER — Encounter: Payer: Self-pay | Admitting: Family Medicine

## 2016-10-25 VITALS — BP 94/64 | HR 88 | Temp 98.7°F | Ht 67.0 in | Wt 258.0 lb

## 2016-10-25 DIAGNOSIS — B3731 Acute candidiasis of vulva and vagina: Secondary | ICD-10-CM

## 2016-10-25 DIAGNOSIS — B373 Candidiasis of vulva and vagina: Secondary | ICD-10-CM

## 2016-10-25 DIAGNOSIS — N3000 Acute cystitis without hematuria: Secondary | ICD-10-CM

## 2016-10-25 LAB — URINALYSIS, COMPLETE
BILIRUBIN UA: NEGATIVE
Glucose, UA: NEGATIVE
KETONES UA: NEGATIVE
Nitrite, UA: NEGATIVE
PH UA: 5.5 (ref 5.0–7.5)
Protein, UA: NEGATIVE
SPEC GRAV UA: 1.02 (ref 1.005–1.030)
UUROB: 0.2 mg/dL (ref 0.2–1.0)

## 2016-10-25 LAB — WET PREP FOR TRICH, YEAST, CLUE
Clue Cell Exam: NEGATIVE
TRICHOMONAS EXAM: NEGATIVE
Yeast Exam: POSITIVE — AB

## 2016-10-25 LAB — MICROSCOPIC EXAMINATION: RENAL EPITHEL UA: NONE SEEN /HPF

## 2016-10-25 MED ORDER — FLUCONAZOLE 150 MG PO TABS: 150.0000 mg | ORAL_TABLET | ORAL | 0 refills | Status: DC

## 2016-10-25 MED ORDER — SULFAMETHOXAZOLE-TRIMETHOPRIM 800-160 MG PO TABS: 1.0000 | ORAL_TABLET | Freq: Two times a day (BID) | ORAL | 0 refills | Status: DC

## 2016-10-25 NOTE — Progress Notes (Signed)
BP 94/64   Pulse 88   Temp 98.7 F (37.1 C) (Oral)   Ht 5\' 7"  (1.702 m)   Wt 258 lb (117 kg)   BMI 40.41 kg/m    Subjective:    Patient ID: Haley Keith, female    DOB: Sep 30, 1970, 46 y.o.   MRN: [id number]  HPI: Haley Keith is a 46 y.o. female presenting on 10/25/2016 for Vaginal Itching; Urinary Frequency; Groin Swelling; and vaginal dryness   HPI Vaginal itching and urinary frequency and vaginal irritation Patient has been having vaginal itching and irritation and urinary frequency and vaginal burning that's been going on for the past few days. She has never had UTIs until she started going through menopause she started having some. She denies any fevers or chills or flank tenderness. She denies any abdominal pain or nausea or vomiting or constipation. She denies any vaginal bleeding recently.  Relevant past medical, surgical, family and social history reviewed and updated as indicated. Interim medical history since our last visit reviewed. Allergies and medications reviewed and updated.  Review of Systems  Constitutional: Negative for chills and fever.  Respiratory: Negative for chest tightness and shortness of breath.   Cardiovascular: Negative for chest pain and leg swelling.  Gastrointestinal: Negative for abdominal pain.  Genitourinary: Positive for dysuria, frequency, urgency, vaginal discharge and vaginal pain. Negative for decreased urine volume, difficulty urinating, flank pain, hematuria and vaginal bleeding.  Musculoskeletal: Negative for back pain and gait problem.  Skin: Negative for rash.  Neurological: Negative for light-headedness and headaches.  Psychiatric/Behavioral: Negative for agitation and behavioral problems.  All other systems reviewed and are negative.   Per HPI unless specifically indicated above     Objective:    BP 94/64   Pulse 88   Temp 98.7 F (37.1 C) (Oral)   Ht 5\' 7"  (1.702 m)   Wt 258 lb (117 kg)   BMI 40.41 kg/m   Wt  Readings from Last 3 Encounters:  10/25/16 258 lb (117 kg)  04/30/16 269 lb (122 kg)  04/22/16 271 lb 12.8 oz (123.3 kg)    Physical Exam  Constitutional: She is oriented to person, place, and time. She appears well-developed and well-nourished. No distress.  Eyes: Conjunctivae are normal.  Cardiovascular: Normal rate, regular rhythm, normal heart sounds and intact distal pulses.   No murmur heard. Pulmonary/Chest: Effort normal and breath sounds normal. No respiratory distress. She has no wheezes. She has no rales.  Abdominal: Soft. Bowel sounds are normal. She exhibits no distension. There is no hepatosplenomegaly. There is no tenderness. There is no rebound, no guarding and no CVA tenderness.  Genitourinary:  Genitourinary Comments: Patient declined exam of left sample  Musculoskeletal: Normal range of motion. She exhibits no edema or tenderness.  Neurological: She is alert and oriented to person, place, and time. Coordination normal.  Skin: Skin is warm and dry. No rash noted. She is not diaphoretic.  Psychiatric: She has a normal mood and affect. Her behavior is normal.  Nursing note and vitals reviewed.     Assessment & Plan:   Problem List Items Addressed This Visit    None    Visit Diagnoses    Acute cystitis without hematuria    -  Primary   Relevant Medications   sulfamethoxazole-trimethoprim (BACTRIM DS) 800-160 MG tablet   Other Relevant Orders   Urinalysis, Complete   Yeast vaginitis       Relevant Medications   fluconazole (DIFLUCAN) 150 MG tablet  sulfamethoxazole-trimethoprim (BACTRIM DS) 800-160 MG tablet   Other Relevant Orders   WET PREP FOR [place], YEAST, CLUE       Follow up plan: Return if symptoms worsen or fail to improve.  Counseling provided for all of the vaccine components Orders Placed This Encounter  Procedures  . WET PREP FOR [place], YEAST, CLUE  . Urinalysis, Complete    Caryl Pina, MD [facility] 10/25/2016, 12:03 PM

## 2018-07-15 IMAGING — DX DG CHEST 2V
2 series · 2 of 2 positions shown · non-contrast
Comparison: None.

CLINICAL DATA: Abdominal pain, nausea and diarrhea peer

EXAM:
CHEST  2 VIEW

[chest pa]
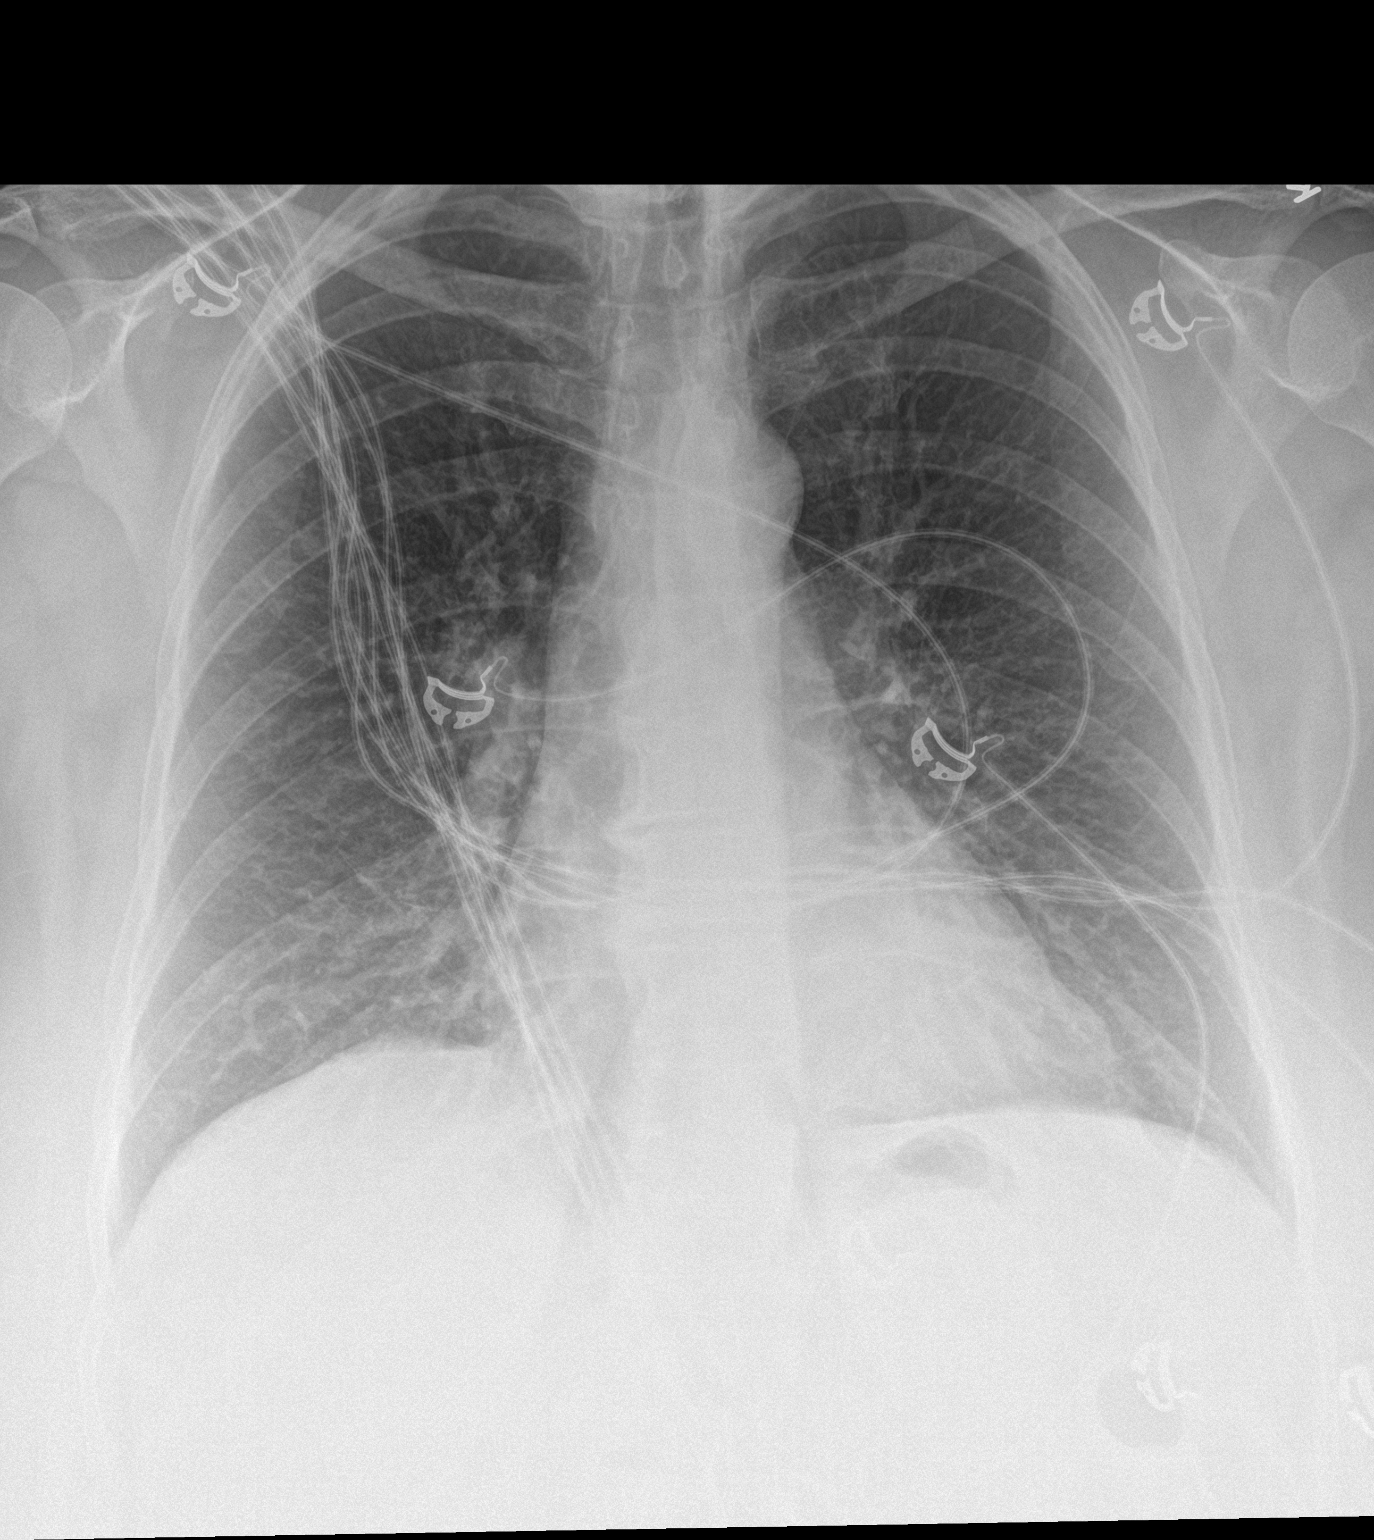

[chest lat]
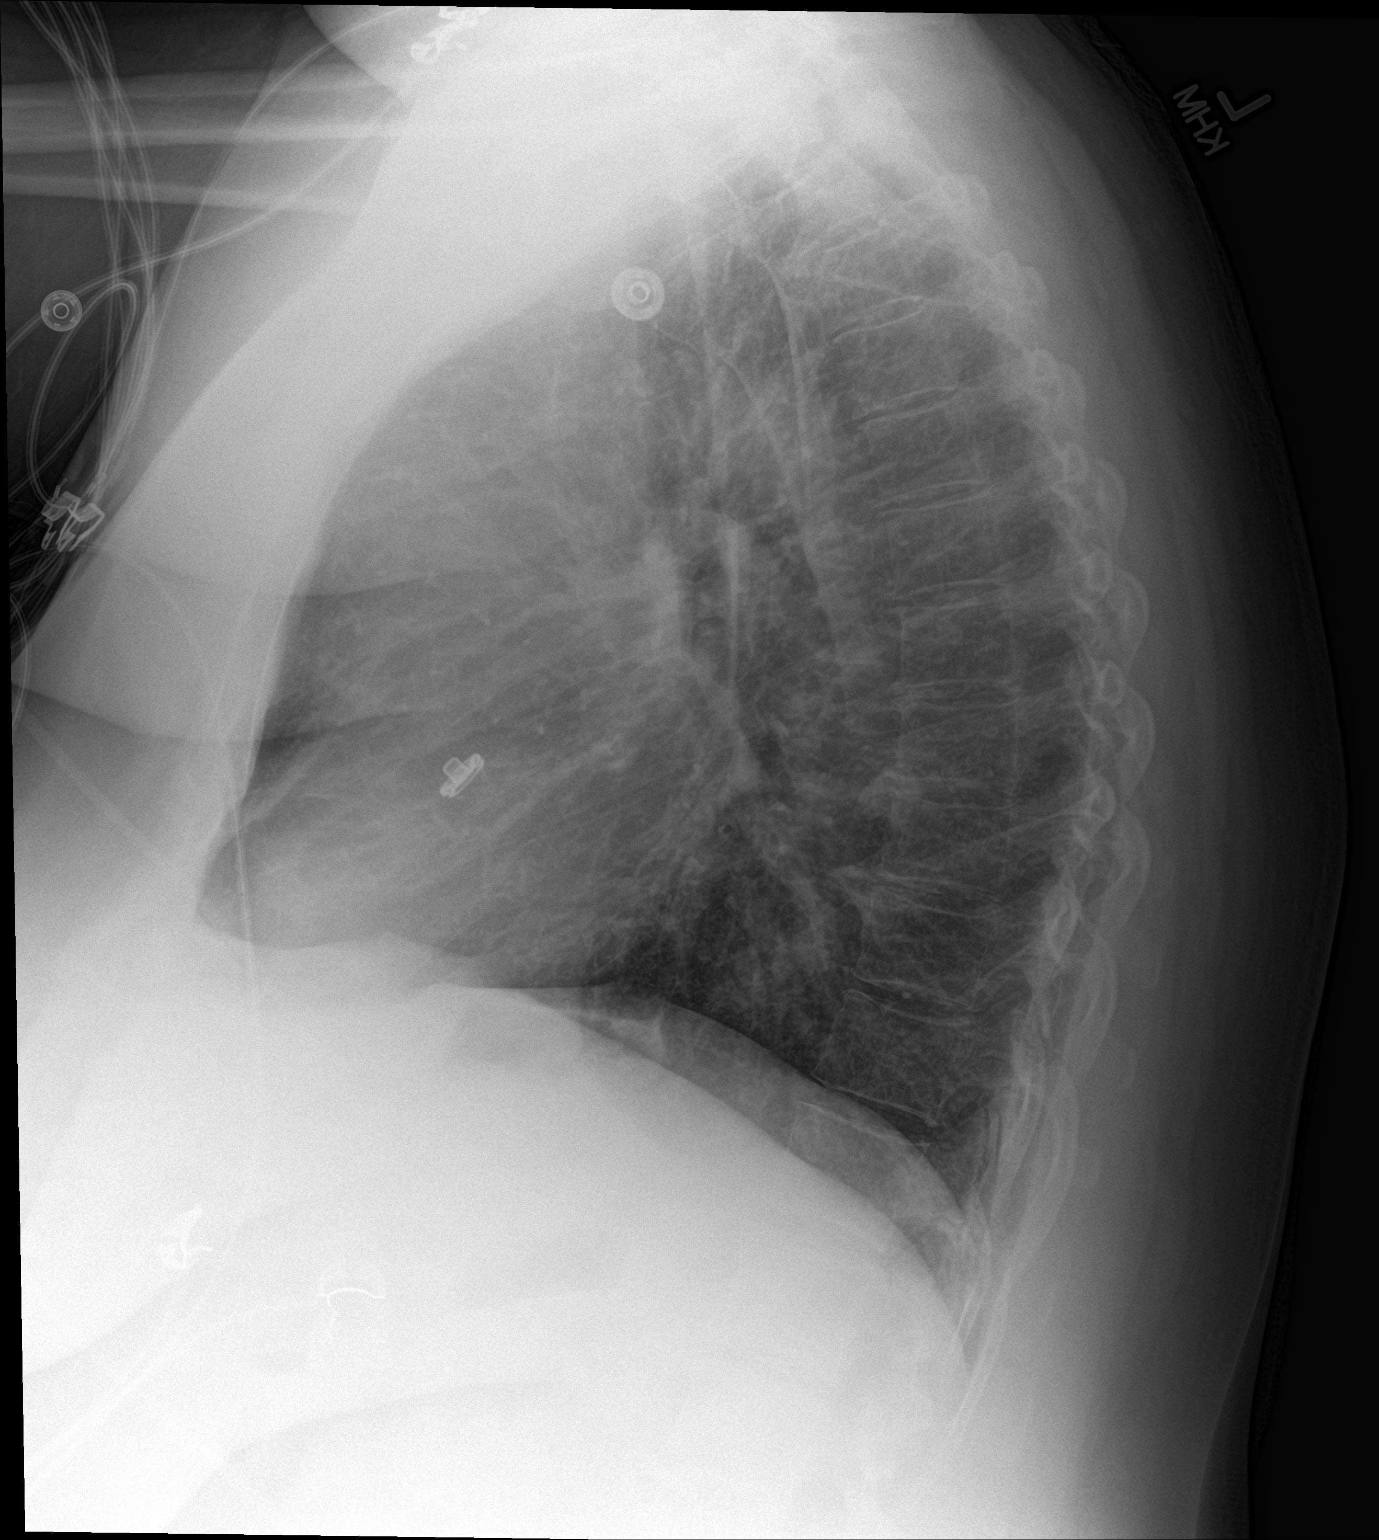

[2 of 2 positions shown; findings below may reference images not displayed]

FINDINGS: Normal heart size. No pleural effusion or edema. No airspace
consolidation identified. The visualized osseous structures are
significant for mild thoracic spondylosis.
IMPRESSION: 1. No acute cardiopulmonary abnormality.

## 2018-07-15 IMAGING — CT CT ABD-PELV W/ CM
2 of 4 series · 16 of 46 positions shown, 18 images · IV contrast (iopamidol)
Comparison: None.

CLINICAL DATA: Abdominal pain and nausea, diarrhea. Constant
symptoms for 2 weeks. Improves when lying down.

EXAM:
CT ABDOMEN AND PELVIS WITH CONTRAST
TECHNIQUE: Multidetector CT imaging of the abdomen and pelvis was performed
using the standard protocol following bolus administration of
intravenous contrast.
CONTRAST:  100mL INHAHG-9WW IOPAMIDOL (INHAHG-9WW) INJECTION 61%

[Series 2: routine abd pel with · axial · 0.87mm/px · z∈[+648,+1082]mm · 13 of 97 slices shown, 15 images]
[im 5/97  soft-tissue]
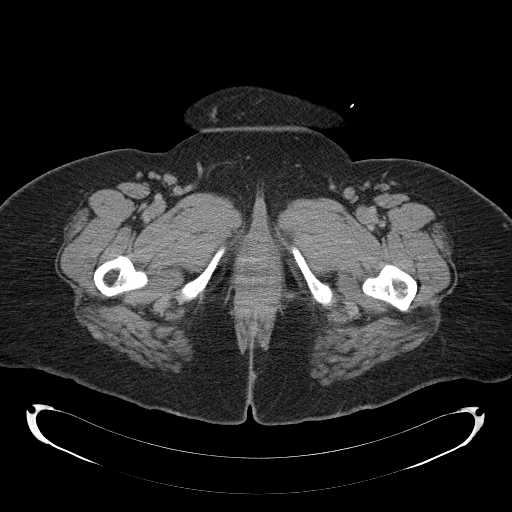
[im 5/97  bone]
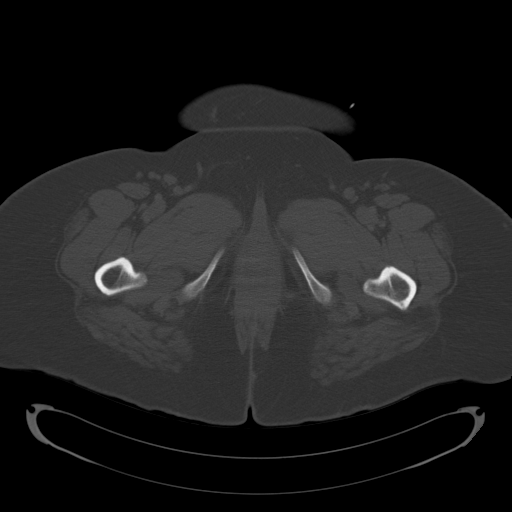
[im 14/97  soft-tissue]
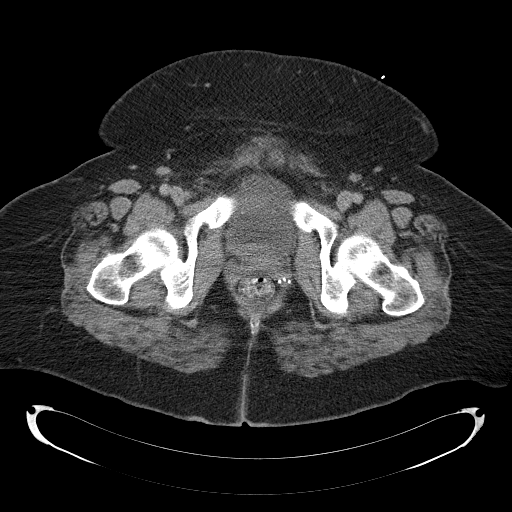
[im 19/97  soft-tissue]
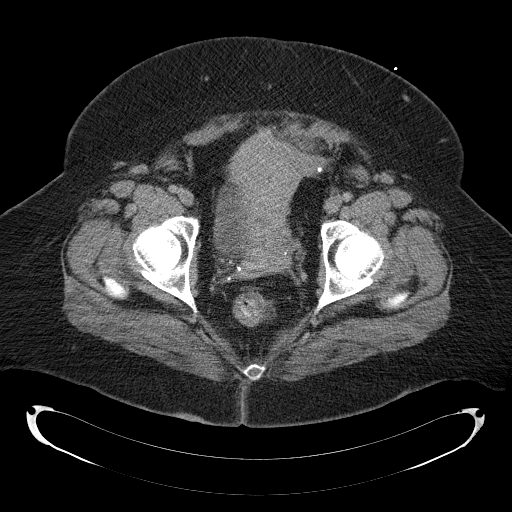
[im 28/97  soft-tissue]
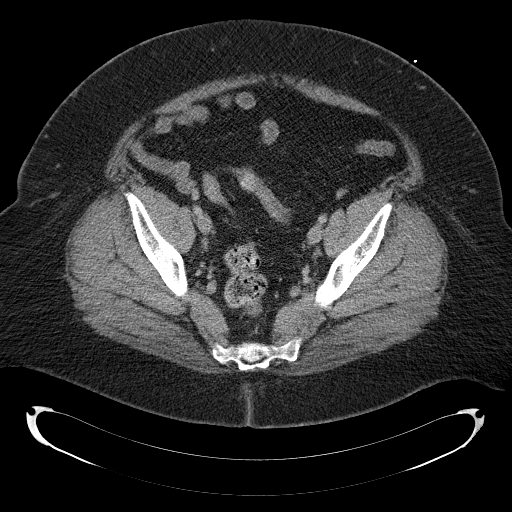
[im 33/97  soft-tissue]
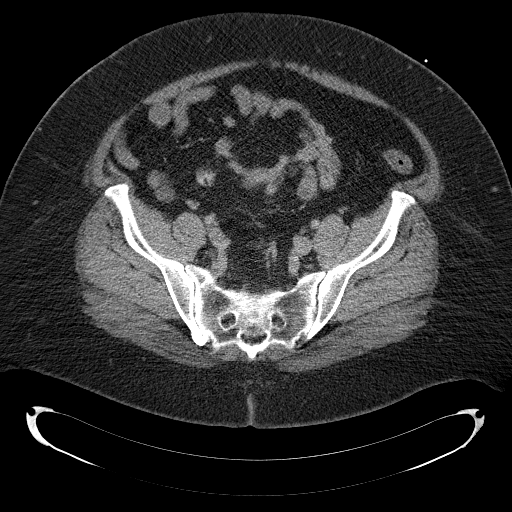
[im 42/97  soft-tissue]
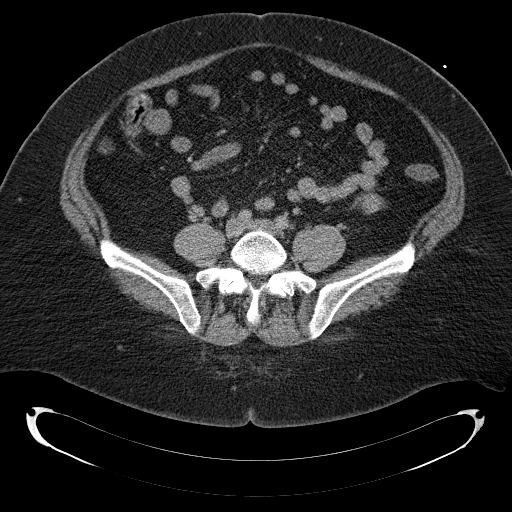
[im 51/97  soft-tissue]
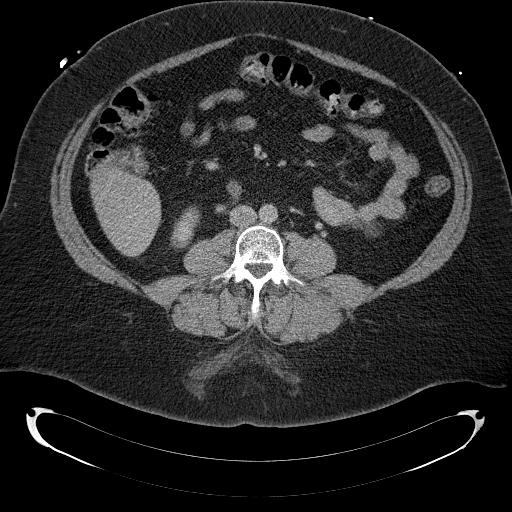
[im 55/97  soft-tissue]
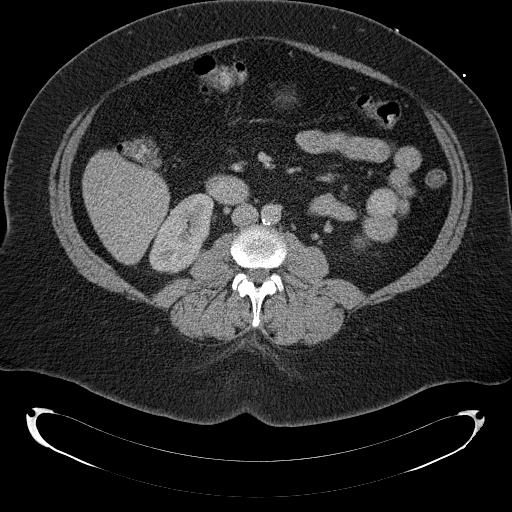
[im 65/97  soft-tissue]
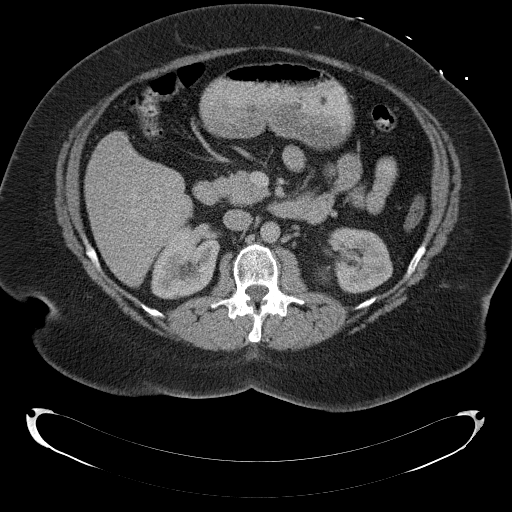
[im 65/97  bone]
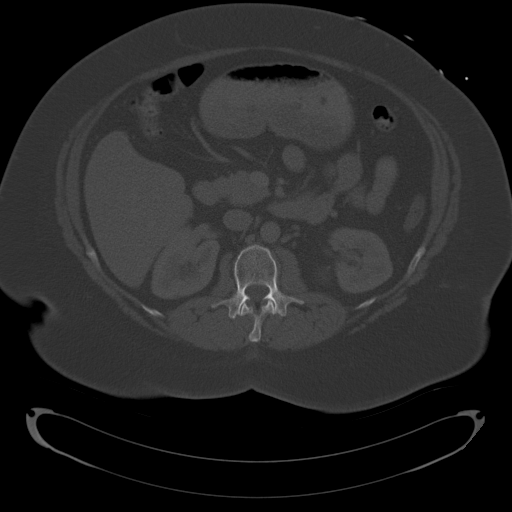
[im 69/97  soft-tissue]
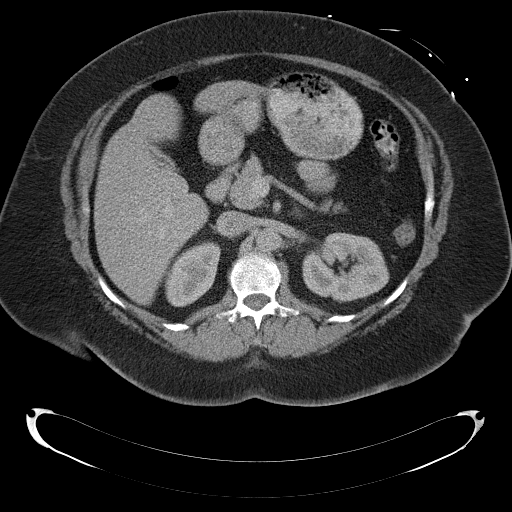
[im 78/97  soft-tissue]
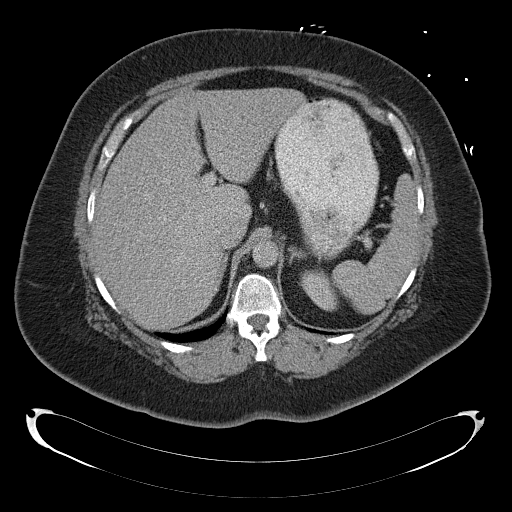
[im 83/97  soft-tissue]
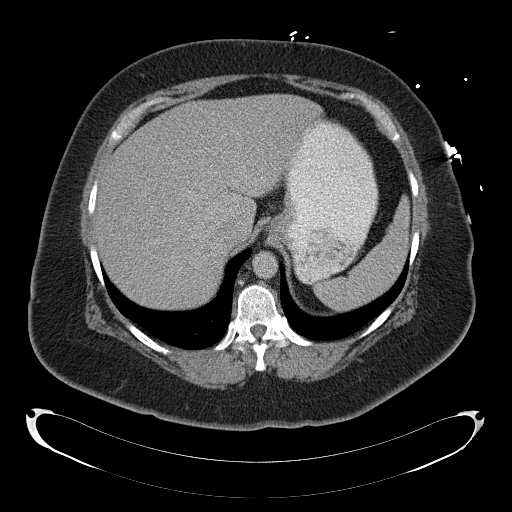
[im 92/97  soft-tissue]
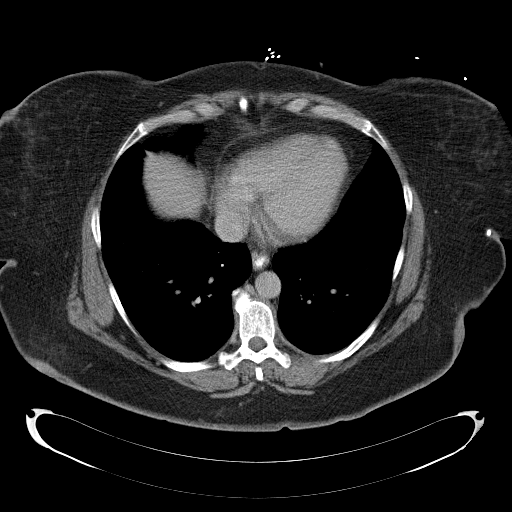

[Series 3: coronal · coronal · 0.94mm/px · 3 of 167 slices shown]
[im 56/167  soft-tissue]
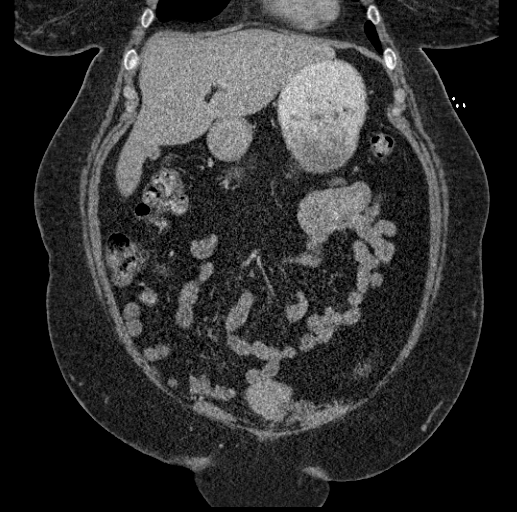
[im 74/167  soft-tissue]
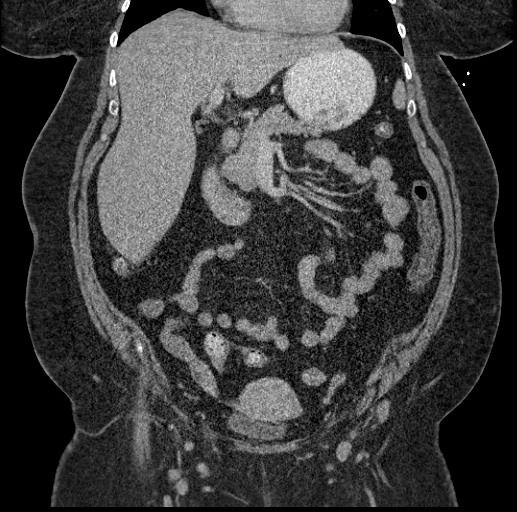
[im 93/167  soft-tissue]
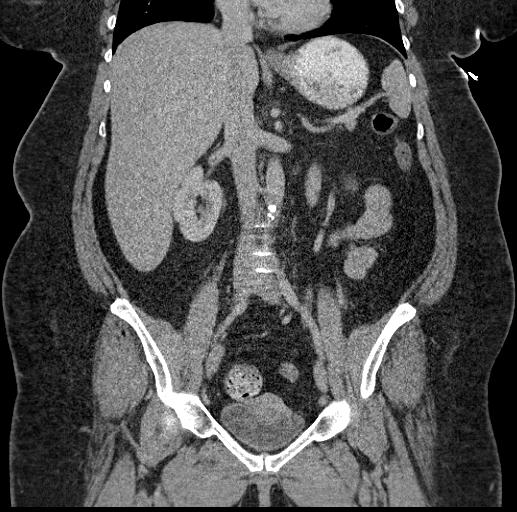

[16 of 46 positions shown; findings below may reference images not displayed]

FINDINGS: Lower chest: Scattered emphysematous changes are identified in the
lung bases. Within the right lower lobe there is a 7 x 5 mm nodule
best seen on image 6 of series 6 with a pleural base location. No
focal consolidations or pleural effusions are identified. The Heart
size is normal. No imaged pericardial effusion or significant
coronary artery calcifications.

Hepatobiliary: No focal liver lesions are identified. Liver is
mildly enlarged.

Pancreas: Unremarkable.

Spleen: Unremarkable.

Renal/Adrenal: Within the lateral limb of the right adrenal gland
there is an enhancing mass measuring 3.1 x 3.3 cm. Given the washout
type enhancement this is consistent with a benign adenoma in no
further evaluation is felt to be necessary. The left adrenal gland
is normal in appearance. There is symmetric enhancement and early
excretion from both kidneys. No hydronephrosis or focal renal mass
identified.

Gastrointestinal tract: The stomach and small bowel loops are normal
in appearance. Previous appendectomy. Colonic loops are normal in
appearance.

Reproductive/Pelvis: Uterus is present. No adnexal mass. No free
pelvic fluid.

Vascular/Lymphatic: No retroperitoneal or mesenteric adenopathy.
Mild atherosclerosis of the abdominal aorta.

Musculoskeletal/Abdominal wall: Abdominal wall is unremarkable in
appearance.

Other: Unremarkable.
IMPRESSION: 1. Hepatomegaly without focal liver lesions.
2. Benign right adrenal adenoma.
3. 6 mm right lower lobe nodule. Non-contrast chest CT at 6-12
months is recommended. If the nodule is stable at time of repeat CT,
then future CT at 18-24 months (from today's scan) is considered
optional for low-risk patients, but is recommended for high-risk
patients. This recommendation follows the consensus statement:
Guidelines for Management of Incidental Pulmonary Nodules Detected
on CT Images:From the [HOSPITAL] 7235; published online
before print (10.1148/radiol.8523272766).
4. Prior appendectomy.

## 2019-05-25 ENCOUNTER — Other Ambulatory Visit: Payer: Self-pay | Admitting: Adult Health Nurse Practitioner

## 2019-05-25 DIAGNOSIS — Z1231 Encounter for screening mammogram for malignant neoplasm of breast: Secondary | ICD-10-CM

## 2022-12-07 ENCOUNTER — Other Ambulatory Visit (HOSPITAL_BASED_OUTPATIENT_CLINIC_OR_DEPARTMENT_OTHER): Payer: Self-pay

## 2022-12-07 MED ORDER — WEGOVY 0.25 MG/0.5ML ~~LOC~~ SOAJ
0.2500 mg | SUBCUTANEOUS | 0 refills | Status: DC
  Filled 2022-12-07: qty 2, 28d supply, fill #0

## 2022-12-16 ENCOUNTER — Other Ambulatory Visit (HOSPITAL_BASED_OUTPATIENT_CLINIC_OR_DEPARTMENT_OTHER): Payer: Self-pay

## 2023-05-19 ENCOUNTER — Other Ambulatory Visit: Payer: Self-pay

## 2023-10-28 ENCOUNTER — Other Ambulatory Visit: Payer: Self-pay

## 2024-01-14 ENCOUNTER — Ambulatory Visit: Admitting: Primary Care

## 2024-01-14 VITALS — BP 114/75 | HR 85 | Ht 67.0 in | Wt 292.0 lb

## 2024-01-14 DIAGNOSIS — F17210 Nicotine dependence, cigarettes, uncomplicated: Secondary | ICD-10-CM

## 2024-01-14 DIAGNOSIS — G4719 Other hypersomnia: Secondary | ICD-10-CM | POA: Diagnosis not present

## 2024-01-14 NOTE — Progress Notes (Signed)
 @Patient  ID: Haley Keith, female    DOB: 03/16/1971, 53 y.o.   MRN: [id number]  No chief complaint on file.   Referring provider: Fonda Hymen, NP  HPI: 53 year old female, current smoker. PMH significant for lung nodule, adrenal adenoma vit D deficiency, obesity.   01/14/2024 Discussed the use of AI scribe software for clinical note transcription with the patient, who gave verbal consent to proceed.  History of Present Illness   Haley Keith is a 53 year old female who presents with sleep disturbances and headaches for a sleep consult.  She experiences persistent fatigue and frequent awakenings at night, often accompanied by excruciating headaches. These headaches primarily occur when she sleeps on her back but are alleviated when she sleeps on her side or with her head elevated on multiple pillows.  Her husband has noted that she snores, although she does not wake herself up from snoring. He is unable to confirm if she stops breathing during sleep as he is also asleep. Her husband has been diagnosed with sleep apnea and uses a CPAP machine.  No significant cardiac history, COPD, asthma, or seizures. She does not use supplemental oxygen, does not sleepwalk, and has no concerns for narcolepsy.  She has been participating in [employer] for two months as part of a weight loss program, which has not been effective for her. She mentions that her insurance may cover certain medications after three months in the program.     Typical bedtime is 9pm. It takes her 30 mins to fall asleep. She wakes up on average 3 times after falling asleep. She starts her day at 5am. Epworth score 12-13/24.    Immunization History  Administered Date(s) Administered   Influenza-Unspecified 05/27/2016, 08/02/2018    Past Medical History:  Diagnosis Date   Thyroid  disease    hypothyroidism    Tobacco History: Social History   Tobacco Use  Smoking Status Every Day   Current packs/day:  1.00   Average packs/day: 1 pack/day for 10.0 years (10.0 ttl pk-yrs)   Types: Cigarettes  Smokeless Tobacco Never   Ready to quit: Not Answered Counseling given: Not Answered   Outpatient Medications Prior to Visit  Medication Sig Dispense Refill   celecoxib (CELEBREX) 200 MG capsule Take 200 mg by mouth 2 (two) times daily.     busPIRone (BUSPAR) 15 MG tablet Take 15 mg by mouth.     fluconazole  (DIFLUCAN ) 150 MG tablet Take 1 tablet (150 mg total) by mouth once a week. 2 tablet 0   levothyroxine  (SYNTHROID , LEVOTHROID) 175 MCG tablet Take 1 tablet (175 mcg total) by mouth daily before breakfast. 30 tablet 0   omeprazole  (PRILOSEC) 20 MG capsule Take 1 capsule (20 mg total) by mouth daily. 30 capsule 3   promethazine  (PHENERGAN ) 25 MG tablet      Semaglutide -Weight Management (WEGOVY ) 0.25 MG/0.5ML SOAJ Inject 0.25 mg into the skin once a week. 2 mL 0   sulfamethoxazole -trimethoprim  (BACTRIM  DS) 800-160 MG tablet Take 1 tablet by mouth 2 (two) times daily. 14 tablet 0   No facility-administered medications prior to visit.    Review of Systems  Review of Systems  Constitutional:  Positive for fatigue.  HENT: Negative.    Respiratory: Negative.    Psychiatric/Behavioral:  Positive for sleep disturbance.      Physical Exam  There were no vitals taken for this visit. Physical Exam Constitutional:      Appearance: Normal appearance. She is obese. She is not  ill-appearing.  HENT:     Head: Normocephalic and atraumatic.  Cardiovascular:     Rate and Rhythm: Normal rate and regular rhythm.  Pulmonary:     Effort: Pulmonary effort is normal.     Breath sounds: Normal breath sounds.  Skin:    General: Skin is warm and dry.  Neurological:     General: No focal deficit present.     Mental Status: She is alert and oriented to person, place, and time. Mental status is at baseline.  Psychiatric:        Mood and Affect: Mood normal.        Behavior: Behavior normal.         Thought Content: Thought content normal.        Judgment: Judgment normal.      Lab Results:  CBC    Component Value Date/Time   WBC 8.6 03/17/2016 1130   RBC 4.49 03/17/2016 1130   HGB 14.3 03/17/2016 1130   HCT 42.2 03/17/2016 1130   PLT 234 03/17/2016 1130   MCV 94.0 03/17/2016 1130   MCV 93.7 04/05/2014 1429   MCH 31.8 03/17/2016 1130   MCHC 33.9 03/17/2016 1130   RDW 15.4 03/17/2016 1130   RDW 14.8 06/23/2013 1734   LYMPHSABS 3.1 03/17/2016 1130   LYMPHSABS 1.0 06/23/2013 1734   MONOABS 0.5 03/17/2016 1130   EOSABS 0.3 03/17/2016 1130   EOSABS 0.1 06/23/2013 1734   BASOSABS 0.0 03/17/2016 1130   BASOSABS 0.0 06/23/2013 1734    BMET    Component Value Date/Time   NA 140 04/22/2016 0846   K 5.0 04/22/2016 0846   CL 101 04/22/2016 0846   CO2 24 04/22/2016 0846   GLUCOSE 95 04/22/2016 0846   GLUCOSE 103 (H) 03/17/2016 1130   BUN 9 04/22/2016 0846   CREATININE 0.64 04/22/2016 0846   CALCIUM 9.1 04/22/2016 0846   GFRNONAA 107 04/22/2016 0846   GFRAA 123 04/22/2016 0846    BNP No results found for: "BNP"  ProBNP No results found for: "PROBNP"  Imaging: No results found.   Assessment & Plan:   1. Excessive daytime sleepiness (Primary) - Home sleep test; Future  Assessment and Plan    Suspected Sleep Apnea Clinical symptoms indicative of sleep apnea include daytime fatigue, snoring and nocturnal headaches. Symptoms are worse when sleeping on her back. No significant cardiac or respiratory history necessitating an in-lab sleep study. A home sleep study is appropriate for further evaluation. Discussed risks of untreated sleep apnea, including cardiac arrhythmias, stroke, diabetes, pulmonary hypertension, and potential link to Alzheimer's disease. Explained severity classification and treatment options including weight loss, oral appliance, CPAP therapy or referral to ENT for possible surgical options. Discussed potential insurance coverage for weight loss  medication, Zepbound, contingent on sleep apnea diagnosis and BMI criteria. - Order 3 night home sleep study through [facility]. - Encourage weight loss, side sleeping position, avoid alcohol prior to bedtime and driving when tired  - Follow up post-sleep study results to discuss treatment options.  Antonio Baumgarten, NP 01/14/2024

## 2024-01-14 NOTE — Patient Instructions (Signed)
  VISIT SUMMARY: Today, you were seen for sleep disturbances and headaches. You experience frequent awakenings at night, often with severe headaches, especially when sleeping on your back. Your husband has noted that you snore, and you have been participating in a weight loss program without much success so far.  YOUR PLAN: -SUSPECTED SLEEP APNEA: Sleep apnea is a condition where your breathing stops and starts during sleep, leading to poor sleep quality and other health issues. Your symptoms, including daytime fatigue, snoring, and headaches, suggest possible obstructive sleep apnea. We will conduct a home sleep study to confirm this. The study involves wearing a chest band, oxygen tubing, and a finger monitor while you sleep. Results will be available in 2-3 weeks. If diagnosed, treatment options include using a CPAP machine for moderate to severe cases and lifestyle changes for mild cases. We also discussed the potential for insurance coverage for weight loss medication if sleep apnea is confirmed.  INSTRUCTIONS: Please complete the home sleep study as instructed. We will follow up in 2-3 weeks to discuss the results and next steps.  Orders: HST   Follow-up 3 months with Beth NP or sooner if needed

## 2024-02-05 NOTE — Telephone Encounter (Signed)
 FYI - routed to [facility]

## 2024-02-05 NOTE — Telephone Encounter (Signed)
 FYI

## 2024-03-10 ENCOUNTER — Telehealth: Payer: Self-pay | Admitting: Primary Care

## 2024-03-10 NOTE — Telephone Encounter (Signed)
 Called to discuss scheduling the 3 month follow up with Haley Ferrari, NP---patent states she did not have the sleep study done due to the out of pocket expense and does not wish to schedule
